# Patient Record
Sex: Male | Born: 1945
Health system: Southern US, Community
[De-identification: ages and names within clinical notes are randomized; demographics above are authoritative.]

## PROBLEM LIST (undated history)

## (undated) DIAGNOSIS — K219 Gastro-esophageal reflux disease without esophagitis: Secondary | ICD-10-CM

## (undated) DIAGNOSIS — E119 Type 2 diabetes mellitus without complications: Secondary | ICD-10-CM

## (undated) DIAGNOSIS — Z8719 Personal history of other diseases of the digestive system: Secondary | ICD-10-CM

## (undated) DIAGNOSIS — I10 Essential (primary) hypertension: Secondary | ICD-10-CM

## (undated) DIAGNOSIS — S52502A Unspecified fracture of the lower end of left radius, initial encounter for closed fracture: Secondary | ICD-10-CM

## (undated) HISTORY — PX: HERNIA REPAIR: SHX51

## (undated) HISTORY — PX: COLONOSCOPY: SHX174

## (undated) HISTORY — PX: ESOPHAGOGASTRODUODENOSCOPY: SHX1529

## (undated) HISTORY — PX: SHOULDER ARTHROSCOPY: SHX128

---

## 2002-06-01 ENCOUNTER — Emergency Department (HOSPITAL_COMMUNITY): Admission: EM | Admit: 2002-06-01 | Discharge: 2002-06-01 | Payer: Self-pay | Admitting: Emergency Medicine

## 2002-06-01 ENCOUNTER — Encounter: Payer: Self-pay | Admitting: Emergency Medicine

## 2004-07-13 ENCOUNTER — Ambulatory Visit (HOSPITAL_COMMUNITY): Admission: RE | Admit: 2004-07-13 | Discharge: 2004-07-13 | Payer: Self-pay | Admitting: *Deleted

## 2004-07-13 ENCOUNTER — Encounter (INDEPENDENT_AMBULATORY_CARE_PROVIDER_SITE_OTHER): Payer: Self-pay | Admitting: *Deleted

## 2005-06-03 ENCOUNTER — Emergency Department (HOSPITAL_COMMUNITY): Admission: EM | Admit: 2005-06-03 | Discharge: 2005-06-03 | Payer: Self-pay | Admitting: Emergency Medicine

## 2007-07-16 ENCOUNTER — Emergency Department (HOSPITAL_COMMUNITY): Admission: EM | Admit: 2007-07-16 | Discharge: 2007-07-16 | Payer: Self-pay | Admitting: Emergency Medicine

## 2008-07-18 ENCOUNTER — Emergency Department (HOSPITAL_COMMUNITY): Admission: EM | Admit: 2008-07-18 | Discharge: 2008-07-18 | Payer: Self-pay | Admitting: Emergency Medicine

## 2008-09-23 ENCOUNTER — Ambulatory Visit: Payer: Self-pay | Admitting: Gastroenterology

## 2008-10-07 ENCOUNTER — Ambulatory Visit: Payer: Self-pay | Admitting: Gastroenterology

## 2010-06-05 LAB — GLUCOSE, CAPILLARY
Glucose-Capillary: 15 mg/dL — CL (ref 70–99)
Glucose-Capillary: 166 mg/dL — ABNORMAL HIGH (ref 70–99)
Glucose-Capillary: 189 mg/dL — ABNORMAL HIGH (ref 70–99)

## 2010-07-16 NOTE — Op Note (Signed)
NAME:  Jacob Macdonald, Jacob Macdonald NO.:  [id number]   MEDICAL RECORD NO.:  [id number]          PATIENT TYPE:  AMB   LOCATION:  ENDO                         FACILITY:  [facility]   PHYSICIAN:  Georgiana Spinner, M.D.    DATE OF BIRTH:  11-Dec-1945   DATE OF PROCEDURE:  07/13/2004  DATE OF DISCHARGE:                                 OPERATIVE REPORT   ANESTHESIA:  Demerol 40 mg, Versed 2 mg additionally.   PROCEDURE:  With the patient mildly sedated in the left lateral decubitus  position, a rectal exam was performed.  The prostate felt normal.  Subsequently, the Olympus videoscopic colonoscope  was inserted in the rectum, passed under direct vision to the cecum,  identified by ileocecal valve and appendiceal orifice, both of which were  photographed.  From this point the colonoscope was slowly withdrawn taking  circumferential views of colonic mucosa, stopping only in the rectum, which  appeared normal on direct, showed hemorrhoids on retroflexed view.  The  endoscope was straightened and withdrawn.  The patient's vital signs and  pulse oximeter remained stable.  The patient tolerated the procedure without  apparent complications.   FINDINGS:  Internal hemorrhoids, otherwise unremarkable colonoscopic  examination to the cecum.   PLAN:  Consider repeat examination possibly in five years.      GMO/MEDQ  D:  07/13/2004  T:  07/13/2004  Job:  [id number]   cc:   Maryruth Bun. Jules Husbands, M.D.  Primary Care Clinic  [facility]  [place], [place]

## 2010-07-16 NOTE — Op Note (Signed)
NAME:  Jacob Macdonald, Jacob Macdonald NO.:  [id number]   MEDICAL RECORD NO.:  [id number]          PATIENT TYPE:  AMB   LOCATION:  ENDO                         FACILITY:  [facility]   PHYSICIAN:  Georgiana Spinner, M.D.    DATE OF BIRTH:  23-Jul-1945   DATE OF PROCEDURE:  07/13/2004  DATE OF DISCHARGE:                                 OPERATIVE REPORT   PROCEDURE:  Upper endoscopy.   INDICATIONS:  Hemoccult positivity.   ANESTHESIA:  1.  Demerol 60 mg.  2.  Versed 8 mg.   PROCEDURE:  With the patient mildly sedated in the left lateral decubitus  position, the Olympus videoscopic endoscope was inserted into the mouth and  passed under direct vision through the esophagus which appeared normal.  There was no evidence of Barrett's esophagus or esophagitis.  We entered  into the stomach.  Fundus, body, antrum, duodenal bulb, and second portion  of duodenum were visualized.  From this point, the endoscope was slowly  withdrawn, taking circumferential views of the duodenal mucosa until the  endoscope had been pulled back into the stomach, placed in retroflexion to  view the stomach from below.  The endoscope was straightened and withdrawn,  taking circumferential views of the remaining gastric and esophageal mucosa,  stopping in the body and fundus, where changes of erythema were seen,  photographed, and biopsied.  The patient's vital signs and pulse oximeter  remained stable.  The patient tolerated the procedure well, without apparent  complication.   FINDINGS:  Erythematous changes of the body and fundus consistent with  possible gastritis.  Await biopsy report.  Patient will call me for results  and follow up with me as an outpatient.  Proceed to colonoscopy as planned.      GMO/MEDQ  D:  07/13/2004  T:  07/13/2004  Job:  [id number]   cc:   Maryruth Bun. [facility]  Primary Care Clinic  [facility]  [place]

## 2013-10-19 ENCOUNTER — Encounter: Payer: Self-pay | Admitting: Gastroenterology

## 2015-07-24 ENCOUNTER — Encounter (HOSPITAL_COMMUNITY): Payer: Self-pay | Admitting: Emergency Medicine

## 2015-07-24 ENCOUNTER — Emergency Department (HOSPITAL_COMMUNITY): Payer: Medicare Other

## 2015-07-24 ENCOUNTER — Emergency Department (HOSPITAL_COMMUNITY)
Admission: EM | Admit: 2015-07-24 | Discharge: 2015-07-24 | Disposition: A | Discharge status: Home / Self Care | Payer: Medicare Other | Attending: Emergency Medicine | Admitting: Emergency Medicine

## 2015-07-24 DIAGNOSIS — Y939 Activity, unspecified: Secondary | ICD-10-CM | POA: Insufficient documentation

## 2015-07-24 DIAGNOSIS — F17210 Nicotine dependence, cigarettes, uncomplicated: Secondary | ICD-10-CM | POA: Insufficient documentation

## 2015-07-24 DIAGNOSIS — S52502A Unspecified fracture of the lower end of left radius, initial encounter for closed fracture: Secondary | ICD-10-CM | POA: Insufficient documentation

## 2015-07-24 DIAGNOSIS — W11XXXA Fall on and from ladder, initial encounter: Secondary | ICD-10-CM | POA: Diagnosis not present

## 2015-07-24 DIAGNOSIS — E119 Type 2 diabetes mellitus without complications: Secondary | ICD-10-CM | POA: Diagnosis not present

## 2015-07-24 DIAGNOSIS — S2232XA Fracture of one rib, left side, initial encounter for closed fracture: Secondary | ICD-10-CM | POA: Diagnosis not present

## 2015-07-24 DIAGNOSIS — S060X9A Concussion with loss of consciousness of unspecified duration, initial encounter: Secondary | ICD-10-CM | POA: Diagnosis not present

## 2015-07-24 DIAGNOSIS — S4992XA Unspecified injury of left shoulder and upper arm, initial encounter: Secondary | ICD-10-CM | POA: Diagnosis not present

## 2015-07-24 DIAGNOSIS — Y929 Unspecified place or not applicable: Secondary | ICD-10-CM | POA: Diagnosis not present

## 2015-07-24 DIAGNOSIS — Z7984 Long term (current) use of oral hypoglycemic drugs: Secondary | ICD-10-CM | POA: Insufficient documentation

## 2015-07-24 DIAGNOSIS — I10 Essential (primary) hypertension: Secondary | ICD-10-CM | POA: Insufficient documentation

## 2015-07-24 DIAGNOSIS — Y999 Unspecified external cause status: Secondary | ICD-10-CM | POA: Diagnosis not present

## 2015-07-24 DIAGNOSIS — S6992XA Unspecified injury of left wrist, hand and finger(s), initial encounter: Secondary | ICD-10-CM | POA: Diagnosis present

## 2015-07-24 DIAGNOSIS — S62102A Fracture of unspecified carpal bone, left wrist, initial encounter for closed fracture: Secondary | ICD-10-CM

## 2015-07-24 HISTORY — DX: Essential (primary) hypertension: I10

## 2015-07-24 HISTORY — DX: Type 2 diabetes mellitus without complications: E11.9

## 2015-07-24 MED ORDER — OXYCODONE-ACETAMINOPHEN 5-325 MG PO TABS: 1.0000 | ORAL_TABLET | Freq: Four times a day (QID) | ORAL | Status: DC | PRN

## 2015-07-24 MED ORDER — HYDROMORPHONE HCL 1 MG/ML IJ SOLN
1.0000 mg | INTRAMUSCULAR | Status: AC | PRN
  Administered 2015-07-24 (×3): 1 mg via INTRAVENOUS
  Filled 2015-07-24 (×3): qty 1

## 2015-07-24 MED ORDER — SODIUM CHLORIDE 0.9 % IV SOLN
INTRAVENOUS | Status: DC
  Administered 2015-07-24: 16:00:00 via INTRAVENOUS

## 2015-07-24 NOTE — ED Provider Notes (Signed)
CSN: [id number]     Arrival date & time 07/24/15  1532 History   First MD Initiated Contact with Patient 07/24/15 1535     Chief Complaint  Patient presents with  . Fall  . Wrist Injury  . Chest Pain  . Shortness of Breath   HPI Patient was working on a ladder today.  The ladder was on uneven surface and as he got towards the top the patient lost his balance and fell. Patient landed on his left side. He thinks he may have hit his head and briefly lost consciousness. He primarily is having pain in his left wrist and the left rib area. The pain in his wrist increases with any movement and palpation. His rib pain increases with breathing  . He denies any abdominal pain. No numbness or weakness. No lower extremity pain. Past Medical History  Diagnosis Date  . Diabetes mellitus without complication (HCC)   . Hypertension    Past Surgical History  Procedure Laterality Date  . Hernia repair     No family history on file. Social History  Substance Use Topics  . Smoking status: Current Every Day Smoker -- 1.00 packs/day    Types: Cigarettes  . Smokeless tobacco: None  . Alcohol Use: No    Review of Systems  All other systems reviewed and are negative.     Allergies  Lisinopril and Penicillins  Home Medications   Prior to Admission medications   Medication Sig Start Date End Date Taking? Authorizing Provider  amLODipine (NORVASC) 5 MG tablet Take 5 mg by mouth daily.   Yes Historical Provider, MD  clonazePAM (KLONOPIN) 1 MG tablet Take 1 mg by mouth at bedtime.  07/23/15  Yes Historical Provider, MD  glipiZIDE (GLUCOTROL) 10 MG tablet Take 10 mg by mouth 2 (two) times daily before a meal.   Yes Historical Provider, MD  HYDROcodone-acetaminophen (NORCO/VICODIN) 5-325 MG tablet Take 1 tablet by mouth 2 (two) times daily. Reported on 07/24/2015 07/16/15  Yes Historical Provider, MD  metFORMIN (GLUCOPHAGE-XR) 500 MG 24 hr tablet Take 1,000 mg by mouth 2 (two) times daily.   Yes  Historical Provider, MD  omeprazole (PRILOSEC) 20 MG capsule Take 20 mg by mouth daily. 06/09/15  Yes Historical Provider, MD  simvastatin (ZOCOR) 80 MG tablet Take 40 mg by mouth at bedtime. 06/06/15  Yes Historical Provider, MD  oxyCODONE-acetaminophen (PERCOCET/ROXICET) 5-325 MG tablet Take 1-2 tablets by mouth every 6 (six) hours as needed. 07/24/15   Linwood Dibbles, MD   BP 155/75 mmHg  Pulse 80  Temp(Src) 97.8 F (36.6 C) (Oral)  Resp 18  SpO2 97% Physical Exam  Constitutional: He appears well-developed and well-nourished. No distress.  HENT:  Head: Normocephalic and atraumatic. Head is without raccoon's eyes and without Battle's sign.  Right Ear: External ear normal.  Left Ear: External ear normal.  Eyes: Lids are normal. Right eye exhibits no discharge. Right conjunctiva has no hemorrhage. Left conjunctiva has no hemorrhage.  Neck: No spinous process tenderness present. No tracheal deviation and no edema present.  Cardiovascular: Normal rate, regular rhythm and normal heart sounds.   Pulmonary/Chest: Effort normal and breath sounds normal. No stridor. No respiratory distress. He exhibits no tenderness, no crepitus and no deformity.  Abdominal: Soft. Normal appearance and bowel sounds are normal. He exhibits no distension and no mass. There is no tenderness.  Negative for bruising  Musculoskeletal:       Cervical back: He exhibits no tenderness, no swelling and no  deformity.       Thoracic back: He exhibits no tenderness, no swelling and no deformity.       Lumbar back: He exhibits no tenderness and no swelling.  Pelvis stable, no ttp  Neurological: He is alert. He has normal strength. No sensory deficit. He exhibits normal muscle tone. GCS eye subscore is 4. GCS verbal subscore is 5. GCS motor subscore is 6.  Able to move all extremities, sensation intact throughout  Skin: He is not diaphoretic.  Psychiatric: He has a normal mood and affect. His speech is normal and behavior is normal.   Nursing note and vitals reviewed.   ED Course  Procedures (including critical care time) Labs Review Labs Reviewed - No data to display  Imaging Review Dg Ribs Unilateral W/chest Left  07/24/2015  CLINICAL DATA:  Left rib pain after fall from ladder today. Initial encounter. EXAM: LEFT RIBS AND CHEST - 3+ VIEW COMPARISON:  None. FINDINGS: Mildly displaced fracture is seen involving the lateral portion of the left seventh rib. No pneumothorax or pleural effusion is noted. No acute pulmonary disease is noted. Cardiomediastinal silhouette appears normal. IMPRESSION: Mildly displaced left seventh rib fracture. Electronically Signed   By: Lupita RaiderJames  Green Jr, M.D.   On: 07/24/2015 16:41   Dg Wrist Complete Left  07/24/2015  CLINICAL DATA:  Left wrist pain and deformity after fall from 8 foot ladder today. EXAM: LEFT WRIST - COMPLETE 3+ VIEW COMPARISON:  None. FINDINGS: There is a displaced/comminuted fracture of the distal left radius, with extension of the fracture line to the articular surface. There is posterior angulation deformity of the distal fracture fragment. Carpal bones appear intact and normally aligned throughout. Slight irregularity of the distal ulna is felt to be chronic. IMPRESSION: Displaced/comminuted fracture of the distal left radius, with extension of the fracture line to the articular surface. Associated posterior angulation deformity of the distal fracture fragment and at least some degree of impaction at the fracture site. Electronically Signed   By: Bary RichardStan  Maynard M.D.   On: 07/24/2015 16:40   Ct Head Wo Contrast  07/24/2015  CLINICAL DATA:  Larey SeatFell off an 8 foot ladder at home, loss of balance, landed on LEFT side, might intake head, denies loss of consciousness ; LEFT wrist deformity, LEFT chest pain EXAM: CT HEAD WITHOUT CONTRAST CT CERVICAL SPINE WITHOUT CONTRAST TECHNIQUE: Multidetector CT imaging of the head and cervical spine was performed following the standard protocol without  intravenous contrast. Multiplanar CT image reconstructions of the cervical spine were also generated. COMPARISON:  None FINDINGS: CT HEAD FINDINGS Mild atrophy. Normal ventricular morphology. No midline shift or mass effect. Tiny old lacunar infarct LEFT thalamus. Otherwise normal appearance of brain parenchyma. No intracranial hemorrhage, mass lesion, evidence acute infarction or extra-axial fluid collection. Visualized paranasal sinuses and mastoid air cells clear. No acute osseous findings. CT CERVICAL SPINE FINDINGS Scattered normal size cervical lymph nodes. Atherosclerotic calcifications particularly RIGHT carotid bifurcation. Prevertebral soft tissues normal thickness. Vertebral body heights maintained. Scattered anterior endplate spur formation throughout cervical spine. No acute fracture, subluxation, or bone destruction. Visualized skullbase intact. Lung apices clear. IMPRESSION: Tiny old lacunar infarct LEFT thalamus. No acute intracranial abnormalities. Degenerative disc disease changes cervical spine. No acute cervical spine abnormalities. Electronically Signed   By: Ulyses SouthwardMark  Boles M.D.   On: 07/24/2015 16:45   Ct Cervical Spine Wo Contrast  07/24/2015  CLINICAL DATA:  Larey SeatFell off an 8 foot ladder at home, loss of balance, landed on LEFT side, might  intake head, denies loss of consciousness ; LEFT wrist deformity, LEFT chest pain EXAM: CT HEAD WITHOUT CONTRAST CT CERVICAL SPINE WITHOUT CONTRAST TECHNIQUE: Multidetector CT imaging of the head and cervical spine was performed following the standard protocol without intravenous contrast. Multiplanar CT image reconstructions of the cervical spine were also generated. COMPARISON:  None FINDINGS: CT HEAD FINDINGS Mild atrophy. Normal ventricular morphology. No midline shift or mass effect. Tiny old lacunar infarct LEFT thalamus. Otherwise normal appearance of brain parenchyma. No intracranial hemorrhage, mass lesion, evidence acute infarction or extra-axial  fluid collection. Visualized paranasal sinuses and mastoid air cells clear. No acute osseous findings. CT CERVICAL SPINE FINDINGS Scattered normal size cervical lymph nodes. Atherosclerotic calcifications particularly RIGHT carotid bifurcation. Prevertebral soft tissues normal thickness. Vertebral body heights maintained. Scattered anterior endplate spur formation throughout cervical spine. No acute fracture, subluxation, or bone destruction. Visualized skullbase intact. Lung apices clear. IMPRESSION: Tiny old lacunar infarct LEFT thalamus. No acute intracranial abnormalities. Degenerative disc disease changes cervical spine. No acute cervical spine abnormalities. Electronically Signed   By: Ulyses Southward M.D.   On: 07/24/2015 16:45   Dg Shoulder Left  07/24/2015  CLINICAL DATA:  Left shoulder pain and limited range of motion after falling 8 feet from a ladder today. EXAM: LEFT SHOULDER - 2+ VIEW COMPARISON:  None. FINDINGS: Mild glenohumeral spur formation greater tuberosity hyperostosis. Mild AC joint degenerative changes. No fracture or dislocation. IMPRESSION: No fracture or dislocation.  Mild degenerative changes. Electronically Signed   By: Beckie Salts M.D.   On: 07/24/2015 17:50   I have personally reviewed and evaluated these images and lab results as part of my medical decision-making.    MDM   Final diagnoses:  Wrist fracture, left, closed, initial encounter  Left rib fracture, closed, initial encounter    Pt has a rib fx and wrist fx.  No other injuires noted on CT scan or plain films.  ABd is soft non tender.  Will dc home with splint and pain meds.  He is seen at [facility] and family called the office prior to arrival.  They called and will see the patient next week  (Dr Janee Morn)   Linwood Dibbles, MD 07/24/15 1807

## 2015-07-24 NOTE — Discharge Instructions (Signed)
Wrist Fracture °A wrist fracture is a break or crack in one of the bones of your wrist. Your wrist is made up of eight small bones at the palm of your hand (carpal bones) and two long bones that make up your forearm (radius and ulna). °CAUSES °· A direct blow to the wrist. °· Falling on an outstretched hand. °· Trauma, such as a car accident or a fall. °RISK FACTORS °Risk factors for wrist fracture include: °· Participating in contact and high-risk sports, such as skiing, biking, and ice skating. °· Taking steroid medicines. °· Smoking. °· Being male. °· Being Caucasian. °· Drinking more than three alcoholic beverages per day. °· Having low or lowered bone density (osteoporosis or osteopenia). °· Age. Older adults have decreased bone density. °· Women who have had menopause. °· History of previous fractures. °SIGNS AND SYMPTOMS °Symptoms of wrist fractures include tenderness, bruising, and inflammation. Additionally, the wrist may hang in an odd position or appear deformed. °DIAGNOSIS °Diagnosis may include: °· Physical exam. °· X-ray. °TREATMENT °Treatment depends on many factors, including the nature and location of the fracture, your age, and your activity level. Treatment for wrist fracture can be nonsurgical or surgical. °Nonsurgical Treatment °A plaster cast or splint may be applied to your wrist if the bone is in a good position. If the fracture is not in good position, it may be necessary for your health care provider to realign it before applying a splint or cast. Usually, a cast or splint will be worn for several weeks. °Surgical Treatment °Sometimes the position of the bone is so far out of place that surgery is required to apply a device to hold it together as it heals. Depending on the fracture, there are a number of options for holding the bone in place while it heals, such as a cast and metal pins. °HOME CARE INSTRUCTIONS °· Keep your injured wrist elevated and move your fingers as much as  possible. °· Do not put pressure on any part of your cast or splint. It may break. °· Use a plastic bag to protect your cast or splint from water while bathing or showering. Do not lower your cast or splint into water. °· Take medicines only as directed by your health care provider. °· Keep your cast or splint clean and dry. If it becomes wet, damaged, or suddenly feels too tight, contact your health care provider right away. °· Do not use any tobacco products including cigarettes, chewing tobacco, or electronic cigarettes. Tobacco can delay bone healing. If you need help quitting, ask your health care provider. °· Keep all follow-up visits as directed by your health care provider. This is important. °· Ask your health care provider if you should take supplements of calcium and vitamins C and D to promote bone healing. °SEEK MEDICAL CARE IF: °· Your cast or splint is damaged, breaks, or gets wet. °· You have a fever. °· You have chills. °· You have continued severe pain or more swelling than you did before the cast was put on. °SEEK IMMEDIATE MEDICAL CARE IF: °· Your hand or fingernails on the injured arm turn blue or gray, or feel cold or numb. °· You have decreased feeling in the fingers of your injured arm. °MAKE SURE YOU: °· Understand these instructions. °· Will watch your condition. °· Will get help right away if you are not doing well or get worse. °  °This information is not intended to replace advice given to you by your   health care provider. Make sure you discuss any questions you have with your health care provider.   Document Released: 11/24/2004 Document Revised: 11/05/2014 Document Reviewed: 03/04/2011 Elsevier Interactive Patient Education 2016 Elsevier Inc.   Rib Fracture A rib fracture is a break or crack in one of the bones of the ribs. The ribs are a group of long, curved bones that wrap around your chest and attach to your spine. They protect your lungs and other organs in the chest  cavity. A broken or cracked rib is often painful, but most do not cause other problems. Most rib fractures heal on their own over time. However, rib fractures can be more serious if multiple ribs are broken or if broken ribs move out of place and push against other structures. CAUSES   A direct blow to the chest. For example, this could happen during contact sports, a car accident, or a fall against a hard object.  Repetitive movements with high force, such as pitching a baseball or having severe coughing spells. SYMPTOMS   Pain when you breathe in or cough.  Pain when someone presses on the injured area. DIAGNOSIS  Your caregiver will perform a physical exam. Various imaging tests may be ordered to confirm the diagnosis and to look for related injuries. These tests may include a chest X-ray, computed tomography (CT), magnetic resonance imaging (MRI), or a bone scan. TREATMENT  Rib fractures usually heal on their own in 1-3 months. The longer healing period is often associated with a continued cough or other aggravating activities. During the healing period, pain control is very important. Medication is usually given to control pain. Hospitalization or surgery may be needed for more severe injuries, such as those in which multiple ribs are broken or the ribs have moved out of place.  HOME CARE INSTRUCTIONS   Avoid strenuous activity and any activities or movements that cause pain. Be careful during activities and avoid bumping the injured rib.  Gradually increase activity as directed by your caregiver.  Only take over-the-counter or prescription medications as directed by your caregiver. Do not take other medications without asking your caregiver first.  Apply ice to the injured area for the first 1-2 days after you have been treated or as directed by your caregiver. Applying ice helps to reduce inflammation and pain.  Put ice in a plastic bag.  Place a towel between your skin and the bag.    Leave the ice on for 15-20 minutes at a time, every 2 hours while you are awake.  Perform deep breathing as directed by your caregiver. This will help prevent pneumonia, which is a common complication of a broken rib. Your caregiver may instruct you to:  Take deep breaths several times a day.  Try to cough several times a day, holding a pillow against the injured area.  Use a device called an incentive spirometer to practice deep breathing several times a day.  Drink enough fluids to keep your urine clear or pale yellow. This will help you avoid constipation.   Do not wear a rib belt or binder. These restrict breathing, which can lead to pneumonia.  SEEK IMMEDIATE MEDICAL CARE IF:   You have a fever.   You have difficulty breathing or shortness of breath.   You develop a continual cough, or you cough up thick or bloody sputum.  You feel sick to your stomach (nausea), throw up (vomit), or have abdominal pain.   You have worsening pain not controlled with  medications.  MAKE SURE YOU:  Understand these instructions.  Will watch your condition.  Will get help right away if you are not doing well or get worse.   This information is not intended to replace advice given to you by your health care provider. Make sure you discuss any questions you have with your health care provider.   Document Released: 02/14/2005 Document Revised: 10/17/2012 Document Reviewed: 04/18/2012 Elsevier Interactive Patient Education [employer]2016 Elsevier Inc.

## 2015-07-24 NOTE — ED Notes (Addendum)
Pt from home via POV after falling from an 218ft ladder. Pt sts that he was on the second rung from the top step and lost his balance. Pt reports that he fell onto his left side. Pt sts he may have hit his head but denies LOC. Pt has obvious L wrist deformity. Pt c/o L sided chest pain just under L breast and complains that pain is worse upon inspiration. Pt is A&O and in NAD. C-collar in place

## 2015-07-24 NOTE — ED Notes (Signed)
Spoke to PA from [facility] where patient receives care. Recommended splint with follow up with Dr. Janee Mornhompson next week. Dr. Lynelle DoctorKnapp notified.

## 2015-07-29 ENCOUNTER — Other Ambulatory Visit: Payer: Self-pay | Admitting: Orthopedic Surgery

## 2015-07-29 ENCOUNTER — Encounter (HOSPITAL_BASED_OUTPATIENT_CLINIC_OR_DEPARTMENT_OTHER): Payer: Self-pay | Admitting: *Deleted

## 2015-07-30 NOTE — H&P (Signed)
BERNIE RANSFORD is an 70 y.o. male.   CC / Reason for Visit: Left wrist injury HPI: This patient is a 70 year old retired [employer] male who presents for evaluation of a left wrist injury that occurred when he fell 8 feet off of a ladder.  Apparently he also fractured a rib.  He was evaluated in emergency department and placed into a sugar tong splint for a distal radius fracture.  He has been taking Percocet, 2 tablets twice daily.  Past Medical History  Diagnosis Date  . Diabetes mellitus without complication (HCC)   . Hypertension   . GERD (gastroesophageal reflux disease)   . History of hiatal hernia   . Distal radius fracture, left     Past Surgical History  Procedure Laterality Date  . Hernia repair    . Shoulder arthroscopy Right     History reviewed. No pertinent family history. Social History:  reports that he has been smoking Cigarettes.  He has been smoking about 1.00 pack per day. He does not have any smokeless tobacco history on file. He reports that he does not drink alcohol or use illicit drugs.  Allergies:  Allergies  Allergen Reactions  . Lisinopril Swelling    REACTION: swelling  . Penicillins Other (See Comments)    REACTION: swelling Has patient had a PCN reaction causing immediate rash, facial/tongue/throat swelling, SOB or lightheadedness with hypotension: unknown Has patient had a PCN reaction causing severe rash involving mucus membranes or skin necrosis: unknown Has patient had a PCN reaction that required hospitalization unknown Has patient had a PCN reaction occurring within the last 10 years: unknown If all of the above answers are "NO", then may proceed with Cephalosporin use.     No prescriptions prior to admission    No results found for this or any previous visit (from the past 48 hour(s)). No results found.  Review of Systems  All other systems reviewed and are negative.   Height  (1.753 m), weight 77.111 kg (170 lb). Physical Exam   Constitutional:  WD, WN, NAD HEENT:  NCAT, EOMI Neuro/Psych:  Alert & oriented to person, place, and time; appropriate mood & affect Lymphatic: No generalized UE edema or lymphadenopathy Extremities / MSK:  Both UE are normal with respect to appearance, ranges of motion, joint stability, muscle strength/tone, sensation, & perfusion except as otherwise noted:  He sugar tong splint is tight with a lot of circumferential wraps, plaster digging into the ulnar side of the little finger.  This is taken down and adjusted by me today.  Intact light touch sensibility throughout the radial and ulnar aspects of all the digital tips with intact motor in the radial, median, and ulnar nerve distributions.  Digits are held nearly in full extension.  Labs / Xrays:  No radiographic studies obtained today.  Injury x-rays are reviewed revealing comminuted displaced intra-articular distal radius fracture  Assessment: Left closed comminuted displaced intra-articular distal radius fracture  Plan:  I discussed these findings with him, and recommended operative treatment to obtain and maintain acceptable alignment in order to optimize functional recovery with healing.  I discussed with him briefly the details of surgery, plan to be locked volar plate fixation, and the typical postoperative course of rehabilitation.  The details of the operative procedure were discussed with the patient.  Questions were invited and answered.  In addition to the goal of the procedure, the risks of the procedure to include but not limited to bleeding; infection; damage to the  nerves or blood vessels that could result in bleeding, numbness, weakness, chronic pain, and the need for additional procedures; stiffness; the need for revision surgery; and anesthetic risks were reviewed.  No specific outcome was guaranteed or implied.  Informed consent was obtained.  We will plan to proceed on Monday, 08-03-15.  Kaeson Kleinert, Linkon A., MD 07/30/2015, 3:31  PM

## 2015-07-31 ENCOUNTER — Encounter (HOSPITAL_BASED_OUTPATIENT_CLINIC_OR_DEPARTMENT_OTHER)
Admission: RE | Admit: 2015-07-31 | Discharge: 2015-07-31 | Disposition: A | Discharge status: Home / Self Care | Payer: Medicare Other | Source: Ambulatory Visit | Attending: Orthopedic Surgery | Admitting: Orthopedic Surgery

## 2015-07-31 DIAGNOSIS — Z880 Allergy status to penicillin: Secondary | ICD-10-CM | POA: Diagnosis not present

## 2015-07-31 DIAGNOSIS — F17200 Nicotine dependence, unspecified, uncomplicated: Secondary | ICD-10-CM | POA: Diagnosis not present

## 2015-07-31 DIAGNOSIS — Z7984 Long term (current) use of oral hypoglycemic drugs: Secondary | ICD-10-CM | POA: Diagnosis not present

## 2015-07-31 DIAGNOSIS — E119 Type 2 diabetes mellitus without complications: Secondary | ICD-10-CM | POA: Diagnosis not present

## 2015-07-31 DIAGNOSIS — I10 Essential (primary) hypertension: Secondary | ICD-10-CM | POA: Diagnosis not present

## 2015-07-31 DIAGNOSIS — W11XXXA Fall on and from ladder, initial encounter: Secondary | ICD-10-CM | POA: Diagnosis not present

## 2015-07-31 DIAGNOSIS — S52502A Unspecified fracture of the lower end of left radius, initial encounter for closed fracture: Secondary | ICD-10-CM | POA: Diagnosis present

## 2015-07-31 LAB — BASIC METABOLIC PANEL
ANION GAP: 11 (ref 5–15)
BUN: 12 mg/dL (ref 6–20)
CALCIUM: 8.7 mg/dL — AB (ref 8.9–10.3)
CHLORIDE: 99 mmol/L — AB (ref 101–111)
CO2: 26 mmol/L (ref 22–32)
Creatinine, Ser: 0.76 mg/dL (ref 0.61–1.24)
Glucose, Bld: 177 mg/dL — ABNORMAL HIGH (ref 65–99)
POTASSIUM: 4.2 mmol/L (ref 3.5–5.1)
SODIUM: 136 mmol/L (ref 135–145)

## 2015-08-03 ENCOUNTER — Ambulatory Visit (HOSPITAL_COMMUNITY): Payer: Medicare Other

## 2015-08-03 ENCOUNTER — Ambulatory Visit (HOSPITAL_BASED_OUTPATIENT_CLINIC_OR_DEPARTMENT_OTHER): Payer: Medicare Other | Admitting: Anesthesiology

## 2015-08-03 ENCOUNTER — Encounter (HOSPITAL_BASED_OUTPATIENT_CLINIC_OR_DEPARTMENT_OTHER): Payer: Self-pay

## 2015-08-03 ENCOUNTER — Ambulatory Visit (HOSPITAL_BASED_OUTPATIENT_CLINIC_OR_DEPARTMENT_OTHER)
Admission: RE | Admit: 2015-08-03 | Discharge: 2015-08-03 | Disposition: A | Discharge status: Home / Self Care | Payer: Medicare Other | Source: Ambulatory Visit | Attending: Orthopedic Surgery | Admitting: Orthopedic Surgery

## 2015-08-03 ENCOUNTER — Encounter (HOSPITAL_BASED_OUTPATIENT_CLINIC_OR_DEPARTMENT_OTHER)
Admission: RE | Disposition: A | Discharge status: Home / Self Care | Payer: Self-pay | Source: Ambulatory Visit | Attending: Orthopedic Surgery

## 2015-08-03 DIAGNOSIS — E119 Type 2 diabetes mellitus without complications: Secondary | ICD-10-CM | POA: Insufficient documentation

## 2015-08-03 DIAGNOSIS — T148XXA Other injury of unspecified body region, initial encounter: Secondary | ICD-10-CM

## 2015-08-03 DIAGNOSIS — Z7984 Long term (current) use of oral hypoglycemic drugs: Secondary | ICD-10-CM | POA: Insufficient documentation

## 2015-08-03 DIAGNOSIS — S52502A Unspecified fracture of the lower end of left radius, initial encounter for closed fracture: Secondary | ICD-10-CM | POA: Diagnosis not present

## 2015-08-03 DIAGNOSIS — F17200 Nicotine dependence, unspecified, uncomplicated: Secondary | ICD-10-CM | POA: Insufficient documentation

## 2015-08-03 DIAGNOSIS — W11XXXA Fall on and from ladder, initial encounter: Secondary | ICD-10-CM | POA: Insufficient documentation

## 2015-08-03 DIAGNOSIS — I10 Essential (primary) hypertension: Secondary | ICD-10-CM | POA: Diagnosis not present

## 2015-08-03 DIAGNOSIS — Z880 Allergy status to penicillin: Secondary | ICD-10-CM | POA: Insufficient documentation

## 2015-08-03 HISTORY — DX: Personal history of other diseases of the digestive system: Z87.19

## 2015-08-03 HISTORY — DX: Gastro-esophageal reflux disease without esophagitis: K21.9

## 2015-08-03 HISTORY — DX: Unspecified fracture of the lower end of left radius, initial encounter for closed fracture: S52.502A

## 2015-08-03 HISTORY — PX: OPEN REDUCTION INTERNAL FIXATION (ORIF) DISTAL RADIAL FRACTURE: SHX5989

## 2015-08-03 LAB — GLUCOSE, CAPILLARY
GLUCOSE-CAPILLARY: 170 mg/dL — AB (ref 65–99)
GLUCOSE-CAPILLARY: 194 mg/dL — AB (ref 65–99)

## 2015-08-03 SURGERY — OPEN REDUCTION INTERNAL FIXATION (ORIF) DISTAL RADIUS FRACTURE
Anesthesia: General | Site: Arm Lower | Laterality: Left

## 2015-08-03 MED ORDER — MIDAZOLAM HCL 2 MG/2ML IJ SOLN
INTRAMUSCULAR | Status: AC
  Filled 2015-08-03: qty 2

## 2015-08-03 MED ORDER — SCOPOLAMINE 1 MG/3DAYS TD PT72: 1.0000 | MEDICATED_PATCH | Freq: Once | TRANSDERMAL | Status: DC | PRN

## 2015-08-03 MED ORDER — CLINDAMYCIN PHOSPHATE 900 MG/50ML IV SOLN
900.0000 mg | INTRAVENOUS | Status: AC
  Administered 2015-08-03: 900 mg via INTRAVENOUS

## 2015-08-03 MED ORDER — BUPIVACAINE-EPINEPHRINE (PF) 0.5% -1:200000 IJ SOLN
INTRAMUSCULAR | Status: DC | PRN
  Administered 2015-08-03: 20 mL via PERINEURAL

## 2015-08-03 MED ORDER — PROPOFOL 10 MG/ML IV BOLUS
INTRAVENOUS | Status: AC
  Filled 2015-08-03: qty 20

## 2015-08-03 MED ORDER — OXYCODONE HCL 5 MG/5ML PO SOLN: 5.0000 mg | Freq: Once | ORAL | Status: DC | PRN

## 2015-08-03 MED ORDER — OXYCODONE HCL 5 MG PO TABS: 5.0000 mg | ORAL_TABLET | Freq: Once | ORAL | Status: DC | PRN

## 2015-08-03 MED ORDER — 0.9 % SODIUM CHLORIDE (POUR BTL) OPTIME
TOPICAL | Status: DC | PRN
  Administered 2015-08-03: 1000 mL

## 2015-08-03 MED ORDER — CLINDAMYCIN PHOSPHATE 900 MG/50ML IV SOLN
INTRAVENOUS | Status: AC
  Filled 2015-08-03: qty 50

## 2015-08-03 MED ORDER — FENTANYL CITRATE (PF) 100 MCG/2ML IJ SOLN: 25.0000 ug | INTRAMUSCULAR | Status: DC | PRN

## 2015-08-03 MED ORDER — DEXAMETHASONE SODIUM PHOSPHATE 4 MG/ML IJ SOLN
INTRAMUSCULAR | Status: DC | PRN
  Administered 2015-08-03: 5 mg via INTRAVENOUS

## 2015-08-03 MED ORDER — DEXAMETHASONE SODIUM PHOSPHATE 10 MG/ML IJ SOLN
INTRAMUSCULAR | Status: AC
  Filled 2015-08-03: qty 1

## 2015-08-03 MED ORDER — FENTANYL CITRATE (PF) 100 MCG/2ML IJ SOLN
INTRAMUSCULAR | Status: AC
  Filled 2015-08-03: qty 2

## 2015-08-03 MED ORDER — LACTATED RINGERS IV SOLN: INTRAVENOUS | Status: DC

## 2015-08-03 MED ORDER — LIDOCAINE 2% (20 MG/ML) 5 ML SYRINGE
INTRAMUSCULAR | Status: DC | PRN
Start: 2015-08-03 — End: 2015-08-03
  Administered 2015-08-03: 100 mg via INTRAVENOUS

## 2015-08-03 MED ORDER — GLYCOPYRROLATE 0.2 MG/ML IJ SOLN: 0.2000 mg | Freq: Once | INTRAMUSCULAR | Status: DC | PRN

## 2015-08-03 MED ORDER — FENTANYL CITRATE (PF) 100 MCG/2ML IJ SOLN
50.0000 ug | INTRAMUSCULAR | Status: AC | PRN
  Administered 2015-08-03: 25 ug via INTRAVENOUS
  Administered 2015-08-03: 100 ug via INTRAVENOUS
  Administered 2015-08-03 (×2): 25 ug via INTRAVENOUS

## 2015-08-03 MED ORDER — MEPERIDINE HCL 25 MG/ML IJ SOLN: 6.2500 mg | INTRAMUSCULAR | Status: DC | PRN

## 2015-08-03 MED ORDER — OXYCODONE-ACETAMINOPHEN 5-325 MG PO TABS: 1.0000 | ORAL_TABLET | Freq: Four times a day (QID) | ORAL | Status: DC | PRN

## 2015-08-03 MED ORDER — ONDANSETRON HCL 4 MG/2ML IJ SOLN
INTRAMUSCULAR | Status: AC
  Filled 2015-08-03: qty 2

## 2015-08-03 MED ORDER — LIDOCAINE 2% (20 MG/ML) 5 ML SYRINGE
INTRAMUSCULAR | Status: AC
  Filled 2015-08-03: qty 5

## 2015-08-03 MED ORDER — PROPOFOL 10 MG/ML IV BOLUS
INTRAVENOUS | Status: DC | PRN
  Administered 2015-08-03: 200 mg via INTRAVENOUS

## 2015-08-03 MED ORDER — MIDAZOLAM HCL 2 MG/2ML IJ SOLN
1.0000 mg | INTRAMUSCULAR | Status: DC | PRN
  Administered 2015-08-03: 2 mg via INTRAVENOUS

## 2015-08-03 MED ORDER — ONDANSETRON HCL 4 MG/2ML IJ SOLN
INTRAMUSCULAR | Status: DC | PRN
  Administered 2015-08-03: 4 mg via INTRAVENOUS

## 2015-08-03 MED ORDER — METOCLOPRAMIDE HCL 5 MG/ML IJ SOLN: 10.0000 mg | Freq: Once | INTRAMUSCULAR | Status: DC | PRN

## 2015-08-03 MED ORDER — LACTATED RINGERS IV SOLN
INTRAVENOUS | Status: DC
  Administered 2015-08-03 (×2): via INTRAVENOUS

## 2015-08-03 SURGICAL SUPPLY — 70 items
BANDAGE COBAN STERILE 2 (GAUZE/BANDAGES/DRESSINGS) IMPLANT
BIT DRILL SOLID 2.0X40MM (BIT) IMPLANT
BIT DRILL SOLID 2.5X40MM (BIT) IMPLANT
BLADE MINI RND TIP GREEN BEAV (BLADE) IMPLANT
BLADE SURG 15 STRL LF DISP TIS (BLADE) ×1 IMPLANT
BLADE SURG 15 STRL SS (BLADE) ×3
BNDG CMPR 9X4 STRL LF SNTH (GAUZE/BANDAGES/DRESSINGS) ×1
BNDG COHESIVE 4X5 TAN STRL (GAUZE/BANDAGES/DRESSINGS) ×3 IMPLANT
BNDG ESMARK 4X9 LF (GAUZE/BANDAGES/DRESSINGS) ×3 IMPLANT
BNDG GAUZE ELAST 4 BULKY (GAUZE/BANDAGES/DRESSINGS) ×4 IMPLANT
BRUSH SCRUB EZ PLAIN DRY (MISCELLANEOUS) IMPLANT
CANISTER SUCT 1200ML W/VALVE (MISCELLANEOUS) ×3 IMPLANT
CHLORAPREP W/TINT 26ML (MISCELLANEOUS) ×3 IMPLANT
CORDS BIPOLAR (ELECTRODE) ×3 IMPLANT
COVER BACK TABLE 60X90IN (DRAPES) ×3 IMPLANT
COVER MAYO STAND STRL (DRAPES) ×3 IMPLANT
CUFF TOURNIQUET SINGLE 18IN (TOURNIQUET CUFF) IMPLANT
CUFF TOURNIQUET SINGLE 24IN (TOURNIQUET CUFF) IMPLANT
DRAPE C-ARM 42X72 X-RAY (DRAPES) ×3 IMPLANT
DRAPE EXTREMITY T 121X128X90 (DRAPE) ×3 IMPLANT
DRAPE SURG 17X23 STRL (DRAPES) ×3 IMPLANT
DRILL SOLID 2.0X40MM (BIT)
DRILL SOLID 2.5X40MM (BIT)
DRSG ADAPTIC 3X8 NADH LF (GAUZE/BANDAGES/DRESSINGS) ×3 IMPLANT
DRSG EMULSION OIL 3X3 NADH (GAUZE/BANDAGES/DRESSINGS) IMPLANT
ELECT REM PT RETURN 9FT ADLT (ELECTROSURGICAL) ×3
ELECTRODE REM PT RTRN 9FT ADLT (ELECTROSURGICAL) ×1 IMPLANT
GAUZE SPONGE 4X4 12PLY STRL (GAUZE/BANDAGES/DRESSINGS) ×3 IMPLANT
GLOVE BIO SURGEON STRL SZ7.5 (GLOVE) ×3 IMPLANT
GLOVE BIOGEL PI IND STRL 7.0 (GLOVE) ×1 IMPLANT
GLOVE BIOGEL PI IND STRL 8 (GLOVE) ×1 IMPLANT
GLOVE BIOGEL PI INDICATOR 7.0 (GLOVE) ×2
GLOVE BIOGEL PI INDICATOR 8 (GLOVE) ×2
GLOVE ECLIPSE 6.5 STRL STRAW (GLOVE) ×3 IMPLANT
GOWN STRL REUS W/ TWL LRG LVL3 (GOWN DISPOSABLE) ×2 IMPLANT
GOWN STRL REUS W/TWL LRG LVL3 (GOWN DISPOSABLE) ×6
GOWN STRL REUS W/TWL XL LVL3 (GOWN DISPOSABLE) ×3 IMPLANT
GUIDE AIMING 1.5MM (WIRE) ×2 IMPLANT
NDL HYPO 25X1 1.5 SAFETY (NEEDLE) IMPLANT
NEEDLE HYPO 25X1 1.5 SAFETY (NEEDLE) IMPLANT
NS IRRIG 1000ML POUR BTL (IV SOLUTION) ×3 IMPLANT
PACK BASIN DAY SURGERY FS (CUSTOM PROCEDURE TRAY) ×3 IMPLANT
PADDING CAST ABS 4INX4YD NS (CAST SUPPLIES) ×2
PADDING CAST ABS COTTON 4X4 ST (CAST SUPPLIES) IMPLANT
PENCIL BUTTON HOLSTER BLD 10FT (ELECTRODE) ×3 IMPLANT
RUBBERBAND STERILE (MISCELLANEOUS) IMPLANT
SKELETAL DYNAMICS DVR SET (Set) ×2 IMPLANT
SLEEVE SCD COMPRESS KNEE MED (MISCELLANEOUS) ×3 IMPLANT
SLING ARM FOAM STRAP LRG (SOFTGOODS) ×3 IMPLANT
SPLINT PLASTER CAST XFAST 3X15 (CAST SUPPLIES) IMPLANT
SPLINT PLASTER XTRA FASTSET 3X (CAST SUPPLIES)
SPONGE GAUZE 4X4 12PLY STER LF (GAUZE/BANDAGES/DRESSINGS) ×2 IMPLANT
STOCKINETTE 6  STRL (DRAPES) ×2
STOCKINETTE 6 STRL (DRAPES) ×1 IMPLANT
SUCTION FRAZIER HANDLE 10FR (MISCELLANEOUS) ×2
SUCTION TUBE FRAZIER 10FR DISP (MISCELLANEOUS) ×1 IMPLANT
SUT VIC AB 2-0 PS2 27 (SUTURE) ×3 IMPLANT
SUT VICRYL 4-0 PS2 18IN ABS (SUTURE) IMPLANT
SUT VICRYL RAPIDE 4-0 (SUTURE) IMPLANT
SUT VICRYL RAPIDE 4/0 PS 2 (SUTURE) ×3 IMPLANT
SYR BULB 3OZ (MISCELLANEOUS) ×3 IMPLANT
SYRINGE 10CC LL (SYRINGE) IMPLANT
TAPE STRIPS DRAPE STRL (GAUZE/BANDAGES/DRESSINGS) ×2 IMPLANT
TOWEL OR 17X24 6PK STRL BLUE (TOWEL DISPOSABLE) ×3 IMPLANT
TOWEL OR NON WOVEN STRL DISP B (DISPOSABLE) ×3 IMPLANT
TUBE CONNECTING 20'X1/4 (TUBING) ×1
TUBE CONNECTING 20X1/4 (TUBING) ×2 IMPLANT
UNDERPAD 30X30 (UNDERPADS AND DIAPERS) ×3 IMPLANT
WIRE FIX 1.5 STANDARD TIP (WIRE)
WIRE FIX 1.5 STD TIP (WIRE) IMPLANT

## 2015-08-03 NOTE — Op Note (Signed)
08/03/2015  7:44 AM  PATIENT:  Jacob Fallsavid H Macdonald  70 y.o. male  PRE-OPERATIVE DIAGNOSIS:  Displaced comminuted left intra-articular distal radius fracture  POST-OPERATIVE DIAGNOSIS:  Same  PROCEDURE:  ORIF Left intra-articular comminuted distal radius fracture (>2 parts)  SURGEON: Cliffton Astersavid A. Janee Mornhompson, MD  PHYSICIAN ASSISTANT: Danielle RankinKirsten Schrader, OPA-C  ANESTHESIA:  regional and general  SPECIMENS:  None  DRAINS: None  EBL:  less than 50 mL  PREOPERATIVE INDICATIONS:  Jacob Macdonald is a  70 y.o. male with a comminuted left intra-articular distal radius fracture  The risks benefits and alternatives were discussed with the patient preoperatively including but not limited to the risks of infection, bleeding, nerve injury, cardiopulmonary complications, the need for revision surgery, among others, and the patient verbalized understanding and consented to proceed.  OPERATIVE IMPLANTS: [employer] distal radius plate and assoc screws/pegs  OPERATIVE PROCEDURE: After receiving prophylactic antibiotics and a regional block, the patient was escorted to the operative theatre and placed in a supine position. General anesthesia was administered.  A surgical "time-out" was performed during which the planned procedure, proposed operative site, and the correct patient identity were compared to the operative consent and agreement confirmed by the circulating nurse according to current facility policy. Following application of a tourniquet to the operative extremity, the exposed skin was pre-scrub with Hibiclens scrub brush and then was prepped with Chloraprep and draped in the usual sterile fashion. The limb was exsanguinated with an Esmarch bandage and the tourniquet inflated to approximately 100mmHg higher than systolic BP.   A sinusoidal-shaped incision was marked and made over the FCR axis and the distal forearm. The skin was incised sharply with scalpel, subcutaneous tissues with blunt and  spreading dissection. The FCR axis was exploited deeply. The pronator quadratus was reflected in an L-shaped ulnarly and the brachioradialis was split in a Z-plasty fashion for later reapproximation. The fracture was inspected and provisionally reduced.  This was confirmed fluoroscopically. The appropriately sized plate was selected and found to fit well. It was placed in its provisional alignment of the radius and this was confirmed fluoroscopically.  It was secured to the radius with a screw through the slotted hole.  Additional adjustments were made as necessary, and the distal holes were all drilled and filled.  Peg/screw length distally was selected on the shorter side of measurements to minimize the risk for dorsal cortical penetration. The remainder of the proximal holes were drilled and filled.   Final images were obtained and the DRUJ was examined for stability. It was found to be sufficiently stable. The wound was then copiously irrigated and the brachioradialis repaired with 2-0 Vicryl Rapide suture followed by repair of the pronator quadratus with the same suture type. Tourniquet was released and additional hemostasis obtained and the skin was closed with 2-0 Vicryl deep dermal buried sutures followed by running 4-0 Vicryl Rapide horizontal mattress suture in the skin. A bulky dressing with a volar plaster component was applied and she was taken to room stable condition.  DISPOSITION: The patient will be discharged home today with typical post-op instructions, returning in 10-15 days for reevaluation with new x-rays of the affected wrist out of the splint to include an inclined lateral and then transition to therapy to have a custom splint constructed and begin rehabilitation.

## 2015-08-03 NOTE — Interval H&P Note (Signed)
History and Physical Interval Note:  08/03/2015 7:43 AM  Jacob Macdonald  has presented today for surgery, with the diagnosis of LEFT DISTAL RADIUS FRACTURE S52.572A  The various methods of treatment have been discussed with the patient and family. After consideration of risks, benefits and other options for treatment, the patient has consented to  Procedure(s) with comments: OPEN TREATMENT OF LEFT DISTAL RADIUS FRACTURE (Left) - GENERAL ANESTHESIA WITH PRE-OP BLOCK as a surgical intervention .  The patient's history has been reviewed, patient examined, no change in status, stable for surgery.  I have reviewed the patient's chart and labs.  Questions were answered to the patient's satisfaction.     Tawanna Funk, Yisrael A.

## 2015-08-03 NOTE — Progress Notes (Signed)
Assisted Dr. Carignan with left, ultrasound guided, interscalene  block. Side rails up, monitors on throughout procedure. See vital signs in flow sheet. Tolerated Procedure well. 

## 2015-08-03 NOTE — Transfer of Care (Signed)
Immediate Anesthesia Transfer of Care Note  Patient: Donnita FallsDavid H Zipp  Procedure(s) Performed: Procedure(s) with comments: OPEN TREATMENT OF LEFT DISTAL RADIUS FRACTURE (Left) - GENERAL ANESTHESIA WITH PRE-OP BLOCK  Patient Location: PACU  Anesthesia Type:GA combined with regional for post-op pain  Level of Consciousness: awake, sedated and patient cooperative  Airway & Oxygen Therapy: Patient Spontanous Breathing and Patient connected to face mask oxygen  Post-op Assessment: Report given to RN and Post -op Vital signs reviewed and stable  Post vital signs: Reviewed and stable  Last Vitals:  Filed Vitals:   08/03/15 0715 08/03/15 0720  BP: 168/63   Pulse: 91 83  Temp:    Resp: 9 11    Last Pain:  Filed Vitals:   08/03/15 0722  PainSc: 8       Patients Stated Pain Goal: 2 (08/03/15 0629)  Complications: No apparent anesthesia complications

## 2015-08-03 NOTE — Anesthesia Postprocedure Evaluation (Signed)
Anesthesia Post Note  Patient: Jacob FallsDavid H Beier  Procedure(s) Performed: Procedure(s) (LRB): OPEN TREATMENT OF LEFT DISTAL RADIUS FRACTURE (Left)  Patient location during evaluation: PACU Anesthesia Type: General and Regional Level of consciousness: awake and alert Pain management: pain level controlled Vital Signs Assessment: post-procedure vital signs reviewed and stable Respiratory status: spontaneous breathing, nonlabored ventilation, respiratory function stable and patient connected to nasal cannula oxygen Cardiovascular status: blood pressure returned to baseline and stable Postop Assessment: no signs of nausea or vomiting Anesthetic complications: no    Last Vitals:  Filed Vitals:   08/03/15 1000 08/03/15 1030  BP:  135/79  Pulse: 70 75  Temp:  36.4 C  Resp: 16 16    Last Pain:  Filed Vitals:   08/03/15 1037  PainSc: 0-No pain                 Phillips Groutarignan, Chelsa Stout

## 2015-08-03 NOTE — Discharge Instructions (Signed)

## 2015-08-03 NOTE — Anesthesia Procedure Notes (Addendum)
Anesthesia Regional Block:  Supraclavicular block  Pre-Anesthetic Checklist: ,, timeout performed, Correct Patient, Correct Site, Correct Laterality, Correct Procedure, Correct Position, site marked, Risks and benefits discussed,  Surgical consent,  Pre-op evaluation,  At surgeon's request and post-op pain management  Laterality: Left and Upper  Prep: Maximum Sterile Barrier Precautions used and chloraprep       Needles:  Injection technique: Single-shot  Needle Type: Echogenic Stimulator Needle     Needle Length: 10cm 10 cm Needle Gauge: 21 and 21 G    Additional Needles:  Procedures: ultrasound guided (picture in chart) Supraclavicular block Narrative:  Injection made incrementally with aspirations every 5 mL.  Performed by: Personally   Additional Notes: Risks, benefits and alternative to block explained extensively.  Patient tolerated procedure well, without complications.   Procedure Name: LMA Insertion Date/Time: 08/03/2015 7:53 AM Performed by: Gar GibbonKEETON, Navdeep Fessenden S Pre-anesthesia Checklist: Patient identified, Emergency Drugs available, Suction available and Patient being monitored Patient Re-evaluated:Patient Re-evaluated prior to inductionOxygen Delivery Method: Circle system utilized Preoxygenation: Pre-oxygenation with 100% oxygen Intubation Type: IV induction Ventilation: Mask ventilation without difficulty LMA: LMA inserted LMA Size: 4.0 Number of attempts: 1 Airway Equipment and Method: Bite block Placement Confirmation: positive ETCO2 Tube secured with: Tape Dental Injury: Teeth and Oropharynx as per pre-operative assessment

## 2015-08-03 NOTE — Anesthesia Preprocedure Evaluation (Addendum)
Anesthesia Evaluation  Patient identified by MRN, date of birth, ID band Patient awake    Reviewed: Allergy & Precautions, NPO status , Patient's Chart, lab work & pertinent test results  Airway Mallampati: II  TM Distance: >3 FB Neck ROM: Full    Dental no notable dental hx.    Pulmonary Current Smoker,    Pulmonary exam normal breath sounds clear to auscultation       Cardiovascular hypertension, Pt. on medications Normal cardiovascular exam Rhythm:Regular Rate:Normal     Neuro/Psych negative neurological ROS  negative psych ROS   GI/Hepatic negative GI ROS, Neg liver ROS,   Endo/Other  diabetes, Type 2, Oral Hypoglycemic Agents  Renal/GU negative Renal ROS  negative genitourinary   Musculoskeletal negative musculoskeletal ROS (+)   Abdominal   Peds negative pediatric ROS (+)  Hematology negative hematology ROS (+)   Anesthesia Other Findings   Reproductive/Obstetrics negative OB ROS                            Anesthesia Physical Anesthesia Plan  ASA: II  Anesthesia Plan: General   Post-op Pain Management: GA combined w/ Regional for post-op pain   Induction: Intravenous  Airway Management Planned: LMA  Additional Equipment:   Intra-op Plan:   Post-operative Plan: Extubation in OR  Informed Consent: I have reviewed the patients History and Physical, chart, labs and discussed the procedure including the risks, benefits and alternatives for the proposed anesthesia with the patient or authorized representative who has indicated his/her understanding and acceptance.   Dental advisory given  Plan Discussed with: CRNA  Anesthesia Plan Comments: (SCB)        Anesthesia Quick Evaluation

## 2015-08-04 ENCOUNTER — Encounter (HOSPITAL_BASED_OUTPATIENT_CLINIC_OR_DEPARTMENT_OTHER): Payer: Self-pay | Admitting: Orthopedic Surgery

## 2016-09-23 DIAGNOSIS — S20212A Contusion of left front wall of thorax, initial encounter: Secondary | ICD-10-CM | POA: Diagnosis not present

## 2017-03-05 IMAGING — RF DG WRIST COMPLETE 3+V*L*
1 series · 4 of 4 positions shown · non-contrast
Comparison: Preoperative wrist series of July 24, 2015

CLINICAL DATA: ORIF of the distal radial fracture.

EXAM:
DG C-ARM 61-120 MIN; LEFT WRIST - COMPLETE 3+ VIEW

[Series 1: run · 4 of 4 slices shown]
[im 1/4]
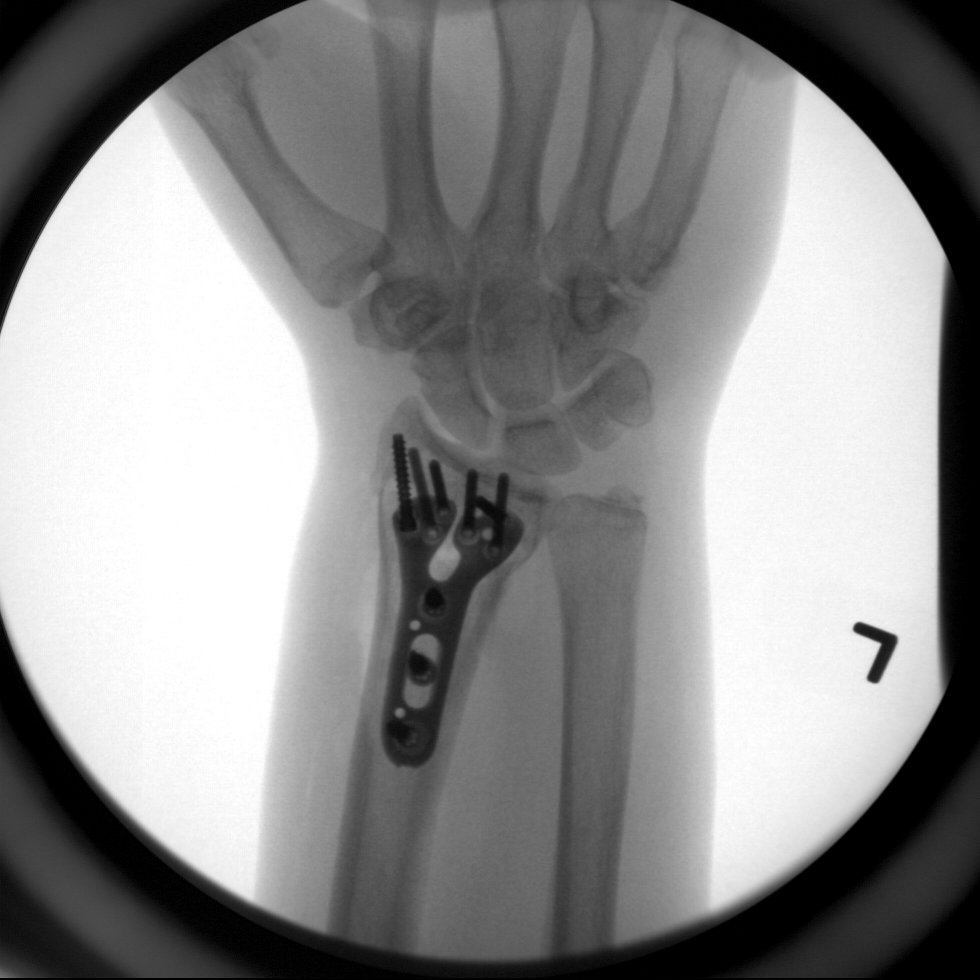
[im 2/4]
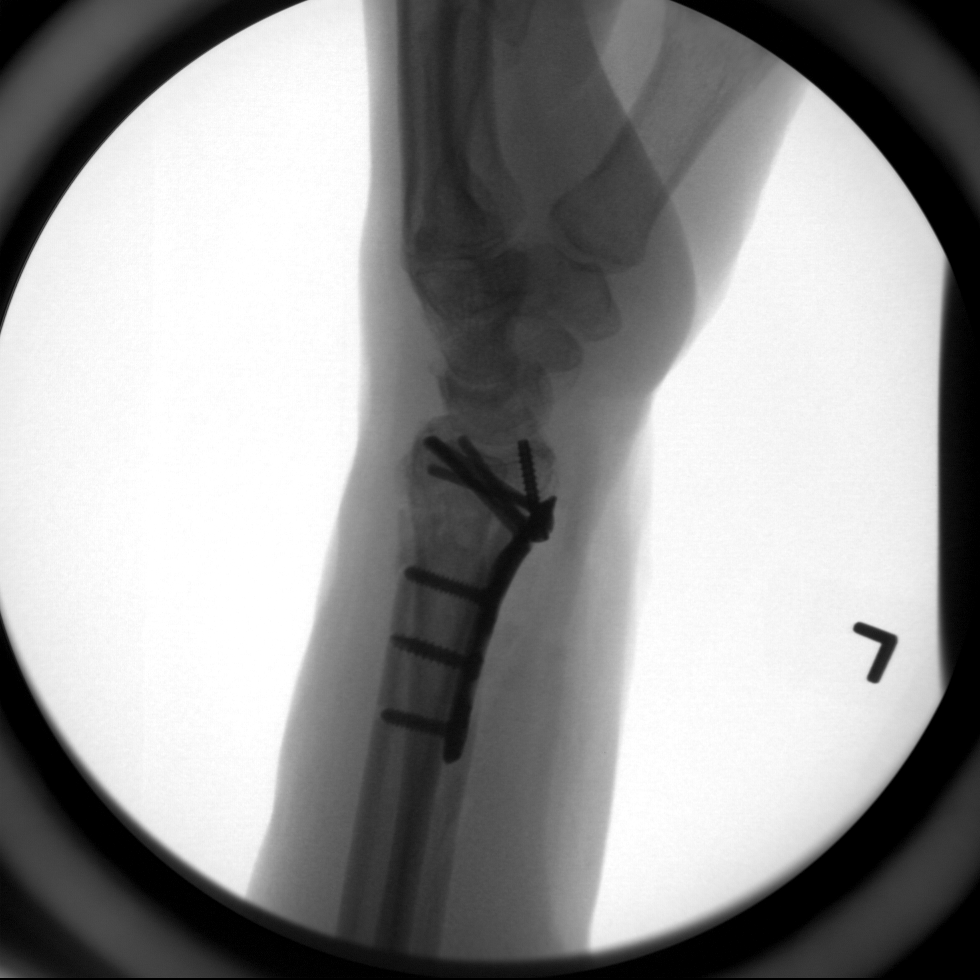
[im 3/4]
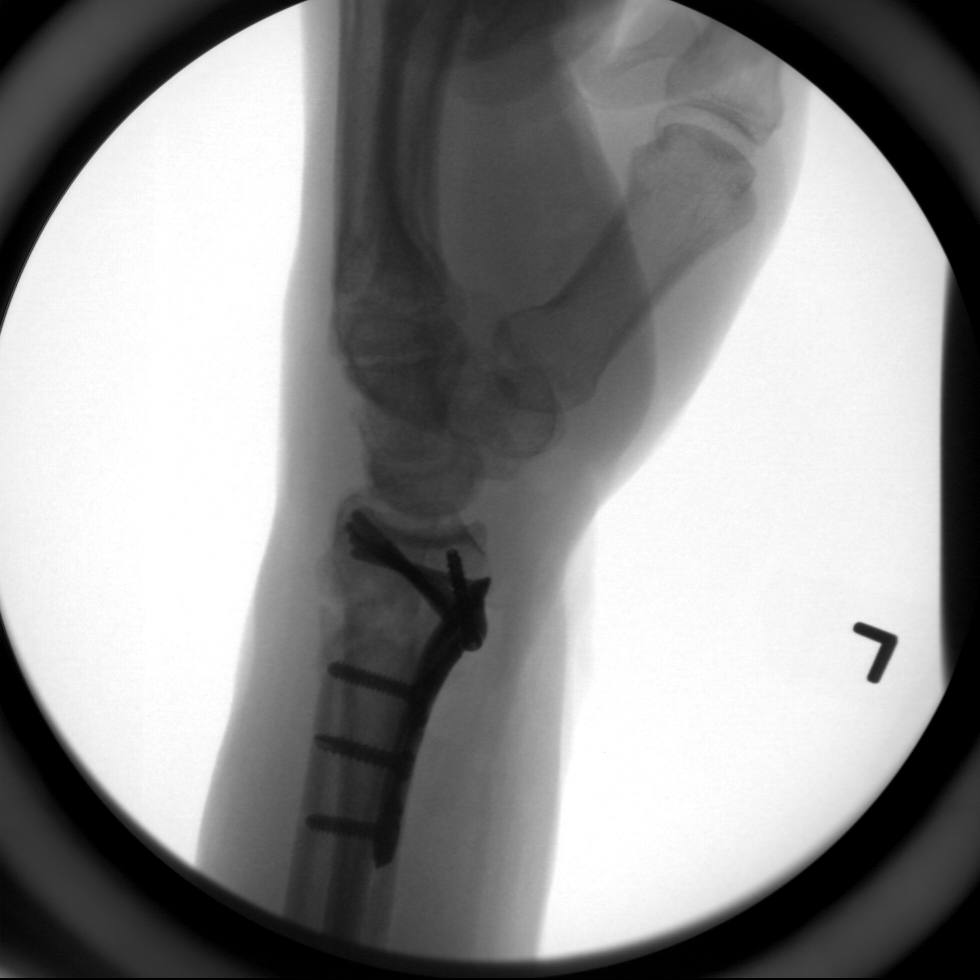
[im 4/4]
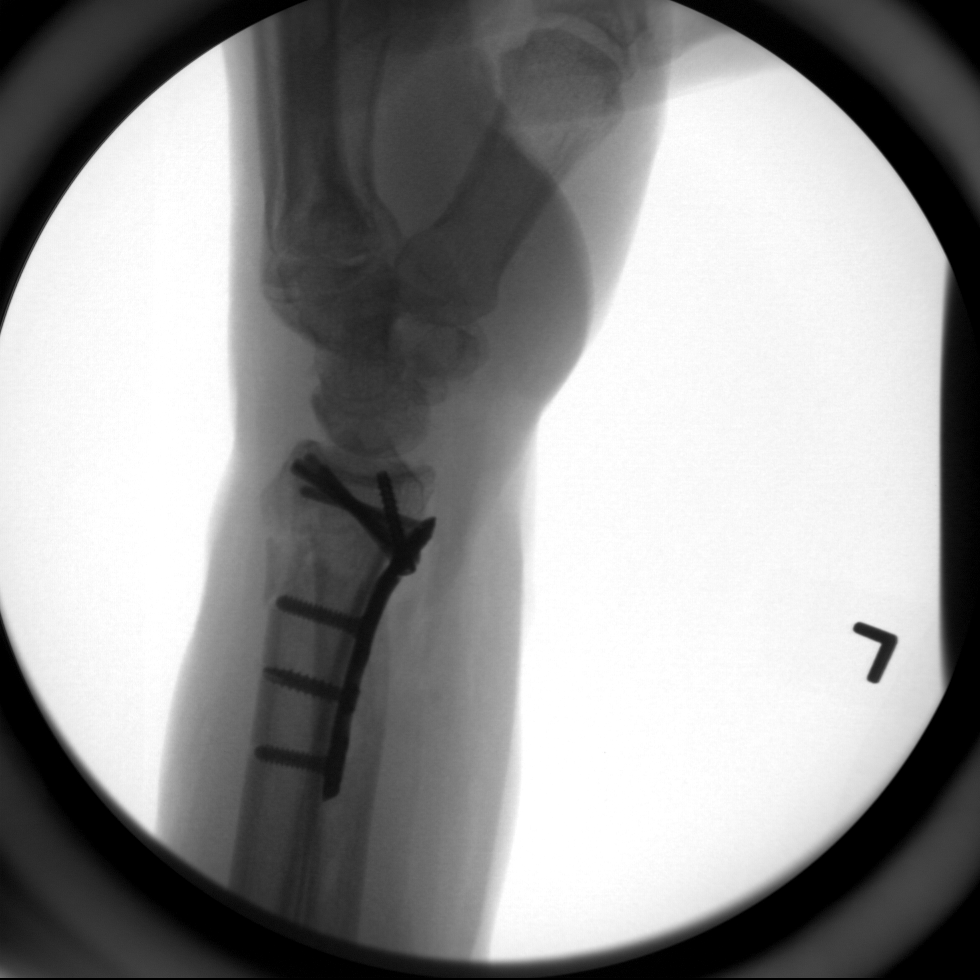

[4 of 4 positions shown; findings below may reference images not displayed]

FINDINGS: Fluoro time reported is 0 minutes, 48 seconds. 4 fluoro spot images
reveal the patient to of undergone plate and screw ORIF of the
distal radial fracture. Alignment of the fracture fragments is near
anatomic. The adjacent ulna appears intact. The carpal bones are
normal where visualized.
IMPRESSION: The patient has undergone successful ORIF with plate and screw
fixation of the comminuted intra-articular distal radial metaphyseal
fracture. There is no immediate postprocedure complication.

## 2017-12-13 ENCOUNTER — Emergency Department (HOSPITAL_COMMUNITY): Payer: PPO

## 2017-12-13 ENCOUNTER — Other Ambulatory Visit: Payer: Self-pay

## 2017-12-13 ENCOUNTER — Inpatient Hospital Stay (HOSPITAL_COMMUNITY)
Admission: EM | Admit: 2017-12-13 | Discharge: 2017-12-15 | DRG: 185 | Disposition: A | Discharge status: Home / Self Care | Payer: PPO | Attending: Surgery | Admitting: Surgery

## 2017-12-13 ENCOUNTER — Encounter (HOSPITAL_COMMUNITY): Payer: Self-pay | Admitting: Emergency Medicine

## 2017-12-13 DIAGNOSIS — S3993XA Unspecified injury of pelvis, initial encounter: Secondary | ICD-10-CM | POA: Diagnosis not present

## 2017-12-13 DIAGNOSIS — S2242XA Multiple fractures of ribs, left side, initial encounter for closed fracture: Secondary | ICD-10-CM | POA: Diagnosis not present

## 2017-12-13 DIAGNOSIS — W19XXXA Unspecified fall, initial encounter: Secondary | ICD-10-CM

## 2017-12-13 DIAGNOSIS — Z91013 Allergy to seafood: Secondary | ICD-10-CM | POA: Diagnosis not present

## 2017-12-13 DIAGNOSIS — I10 Essential (primary) hypertension: Secondary | ICD-10-CM | POA: Diagnosis present

## 2017-12-13 DIAGNOSIS — E1165 Type 2 diabetes mellitus with hyperglycemia: Secondary | ICD-10-CM | POA: Diagnosis present

## 2017-12-13 DIAGNOSIS — Z880 Allergy status to penicillin: Secondary | ICD-10-CM | POA: Diagnosis not present

## 2017-12-13 DIAGNOSIS — Z79899 Other long term (current) drug therapy: Secondary | ICD-10-CM

## 2017-12-13 DIAGNOSIS — K219 Gastro-esophageal reflux disease without esophagitis: Secondary | ICD-10-CM | POA: Diagnosis present

## 2017-12-13 DIAGNOSIS — S2231XA Fracture of one rib, right side, initial encounter for closed fracture: Secondary | ICD-10-CM | POA: Diagnosis not present

## 2017-12-13 DIAGNOSIS — F17210 Nicotine dependence, cigarettes, uncomplicated: Secondary | ICD-10-CM | POA: Diagnosis present

## 2017-12-13 DIAGNOSIS — S2232XA Fracture of one rib, left side, initial encounter for closed fracture: Secondary | ICD-10-CM

## 2017-12-13 DIAGNOSIS — Z7982 Long term (current) use of aspirin: Secondary | ICD-10-CM | POA: Diagnosis not present

## 2017-12-13 DIAGNOSIS — W11XXXA Fall on and from ladder, initial encounter: Secondary | ICD-10-CM | POA: Diagnosis not present

## 2017-12-13 DIAGNOSIS — Z888 Allergy status to other drugs, medicaments and biological substances status: Secondary | ICD-10-CM | POA: Diagnosis not present

## 2017-12-13 DIAGNOSIS — Z23 Encounter for immunization: Secondary | ICD-10-CM | POA: Diagnosis not present

## 2017-12-13 DIAGNOSIS — S3991XA Unspecified injury of abdomen, initial encounter: Secondary | ICD-10-CM | POA: Diagnosis not present

## 2017-12-13 DIAGNOSIS — S0990XA Unspecified injury of head, initial encounter: Secondary | ICD-10-CM | POA: Diagnosis not present

## 2017-12-13 DIAGNOSIS — S299XXA Unspecified injury of thorax, initial encounter: Secondary | ICD-10-CM | POA: Diagnosis not present

## 2017-12-13 DIAGNOSIS — Z7984 Long term (current) use of oral hypoglycemic drugs: Secondary | ICD-10-CM

## 2017-12-13 DIAGNOSIS — S199XXA Unspecified injury of neck, initial encounter: Secondary | ICD-10-CM | POA: Diagnosis not present

## 2017-12-13 DIAGNOSIS — J939 Pneumothorax, unspecified: Secondary | ICD-10-CM

## 2017-12-13 DIAGNOSIS — R739 Hyperglycemia, unspecified: Secondary | ICD-10-CM | POA: Diagnosis present

## 2017-12-13 DIAGNOSIS — S59901A Unspecified injury of right elbow, initial encounter: Secondary | ICD-10-CM | POA: Diagnosis not present

## 2017-12-13 DIAGNOSIS — E119 Type 2 diabetes mellitus without complications: Secondary | ICD-10-CM | POA: Diagnosis not present

## 2017-12-13 DIAGNOSIS — R102 Pelvic and perineal pain: Secondary | ICD-10-CM | POA: Diagnosis not present

## 2017-12-13 DIAGNOSIS — S2249XA Multiple fractures of ribs, unspecified side, initial encounter for closed fracture: Secondary | ICD-10-CM | POA: Diagnosis present

## 2017-12-13 DIAGNOSIS — M25521 Pain in right elbow: Secondary | ICD-10-CM | POA: Diagnosis not present

## 2017-12-13 LAB — URINALYSIS, ROUTINE W REFLEX MICROSCOPIC
BACTERIA UA: NONE SEEN
BILIRUBIN URINE: NEGATIVE
Glucose, UA: >=500 mg/dL — AB
KETONES UR: 5 mg/dL — AB
LEUKOCYTES UA: NEGATIVE
NITRITE: NEGATIVE
PROTEIN: NEGATIVE mg/dL
Specific Gravity, Urine: 1.018 (ref 1.005–1.030)
pH: 6 (ref 5.0–8.0)

## 2017-12-13 LAB — CBC
HEMATOCRIT: 42.4 % (ref 39.0–52.0)
HEMOGLOBIN: 13.3 g/dL (ref 13.0–17.0)
MCH: 25.6 pg — ABNORMAL LOW (ref 26.0–34.0)
MCHC: 31.4 g/dL (ref 30.0–36.0)
MCV: 81.7 fL (ref 80.0–100.0)
NRBC: 0 % (ref 0.0–0.2)
Platelets: 268 10*3/uL (ref 150–400)
RBC: 5.19 MIL/uL (ref 4.22–5.81)
RDW: 15.5 % (ref 11.5–15.5)
WBC: 10.2 10*3/uL (ref 4.0–10.5)

## 2017-12-13 LAB — BASIC METABOLIC PANEL
Anion gap: 12 (ref 5–15)
BUN: 11 mg/dL (ref 8–23)
CHLORIDE: 101 mmol/L (ref 98–111)
CO2: 24 mmol/L (ref 22–32)
CREATININE: 0.85 mg/dL (ref 0.61–1.24)
Calcium: 9 mg/dL (ref 8.9–10.3)
GFR calc Af Amer: >60 mL/min (ref >60)
GLUCOSE: 244 mg/dL — AB (ref 70–99)
Potassium: 4.1 mmol/L (ref 3.5–5.1)
SODIUM: 137 mmol/L (ref 135–145)

## 2017-12-13 LAB — I-STAT TROPONIN, ED: TROPONIN I, POC: 0 ng/mL (ref 0.00–0.08)

## 2017-12-13 LAB — SAMPLE TO BLOOD BANK

## 2017-12-13 MED ORDER — SODIUM CHLORIDE 0.9 % IV BOLUS
1000.0000 mL | Freq: Once | INTRAVENOUS | Status: AC
  Administered 2017-12-13: 1000 mL via INTRAVENOUS

## 2017-12-13 MED ORDER — IOPAMIDOL (ISOVUE-300) INJECTION 61%
INTRAVENOUS | Status: AC
  Filled 2017-12-13: qty 100

## 2017-12-13 MED ORDER — MORPHINE SULFATE (PF) 4 MG/ML IV SOLN
4.0000 mg | Freq: Once | INTRAVENOUS | Status: AC
  Administered 2017-12-13: 4 mg via INTRAVENOUS
  Filled 2017-12-13: qty 1

## 2017-12-13 MED ORDER — IOPAMIDOL (ISOVUE-300) INJECTION 61%
100.0000 mL | Freq: Once | INTRAVENOUS | Status: AC | PRN
  Administered 2017-12-13: 100 mL via INTRAVENOUS

## 2017-12-13 MED ORDER — HYDROMORPHONE HCL 1 MG/ML IJ SOLN
0.5000 mg | Freq: Once | INTRAMUSCULAR | Status: AC
  Administered 2017-12-13: 0.5 mg via INTRAVENOUS
  Filled 2017-12-13: qty 1

## 2017-12-13 MED ORDER — SODIUM CHLORIDE 0.9 % IJ SOLN
INTRAMUSCULAR | Status: AC
  Filled 2017-12-13: qty 50

## 2017-12-13 MED ORDER — TETANUS-DIPHTH-ACELL PERTUSSIS 5-2.5-18.5 LF-MCG/0.5 IM SUSP
0.5000 mL | Freq: Once | INTRAMUSCULAR | Status: AC
  Administered 2017-12-13: 0.5 mL via INTRAMUSCULAR
  Filled 2017-12-13: qty 0.5

## 2017-12-13 MED ORDER — SODIUM CHLORIDE 0.9 % IV SOLN
INTRAVENOUS | Status: DC
  Administered 2017-12-13: 22:00:00 via INTRAVENOUS

## 2017-12-13 NOTE — ED Notes (Signed)
Report attempted to receiving RN. This RN was requested to call back.

## 2017-12-13 NOTE — ED Triage Notes (Addendum)
Patient reports fall approximately five feet off ladder yesterday, landing on right hip. C/o sternum pain radiating to ribs and back. Denies head injury and LOC. Pain worsens with movement. Denies neck pain.

## 2017-12-13 NOTE — Progress Notes (Signed)
ED TO INPATIENT HANDOFF REPORT  Name/Age/Gender Jacob Macdonald 72 y.o. male  Code Status   Home/SNF/Other Home  Chief Complaint fall  Level of Care/Admitting Diagnosis ED Disposition    ED Disposition Condition [facility]: [place] [[id number]]  Level of Care: Med-Surg [16]  Diagnosis: Multiple rib fractures [496759]  Admitting Physician: Donnie Mesa [[id number]]  Attending Physician: Donnie Mesa [[id number]]  PT Class (Do Not Modify): Observation [104]  PT Acc Code (Do Not Modify): Observation [10022]       Medical History Past Medical History:  Diagnosis Date  . Diabetes mellitus without complication ([place])   . Distal radius fracture, left   . GERD (gastroesophageal reflux disease)   . History of hiatal hernia   . Hypertension     Allergies Allergies  Allergen Reactions  . Lisinopril Swelling    REACTION: swelling  . Penicillins Other (See Comments)    REACTION: swelling Has patient had a PCN reaction causing immediate rash, facial/tongue/throat swelling, SOB or lightheadedness with hypotension: unknown Has patient had a PCN reaction causing severe rash involving mucus membranes or skin necrosis: unknown Has patient had a PCN reaction that required hospitalization unknown Has patient had a PCN reaction occurring within the last 10 years: unknown If all of the above answers are "NO", then may proceed with Cephalosporin use.   . Shellfish Allergy Nausea And Vomiting         IV Location/Drains/Wounds Patient Lines/Drains/Airways Status   Active Line/Drains/Airways    Name:   Placement date:   Placement time:   Site:   Days:   Peripheral IV 12/13/17 Left Antecubital   12/13/17    1817    Antecubital   less than 1   Incision (Closed) 08/03/15 Wrist Left   08/03/15    0857     863          Labs/Imaging Results for orders placed or performed during the hospital encounter of 12/13/17 (from the past 48 hour(s))  CBC      Status: Abnormal   Collection Time: 12/13/17  6:13 PM  Result Value Ref Range   WBC 10.2 4.0 - 10.5 K/uL   RBC 5.19 4.22 - 5.81 MIL/uL   Hemoglobin 13.3 13.0 - 17.0 g/dL   HCT 42.4 39.0 - 52.0 %   MCV 81.7 80.0 - 100.0 fL   MCH 25.6 (L) 26.0 - 34.0 pg   MCHC 31.4 30.0 - 36.0 g/dL   RDW 15.5 11.5 - 15.5 %   Platelets 268 150 - 400 K/uL   nRBC 0.0 0.0 - 0.2 %    Comment: Performed at [facility], [place] [address] [place], [place] [address]  Sample to Blood Bank     Status: None   Collection Time: 12/13/17  6:13 PM  Result Value Ref Range   Blood Bank Specimen SAMPLE AVAILABLE FOR TESTING    Sample Expiration      12/16/2017 Performed at [facility], [place] [address] [place], [place] [address]   Basic metabolic panel     Status: Abnormal   Collection Time: 12/13/17  6:13 PM  Result Value Ref Range   Sodium 137 135 - 145 mmol/L   Potassium 4.1 3.5 - 5.1 mmol/L   Chloride 101 98 - 111 mmol/L   CO2 24 22 - 32 mmol/L   Glucose, Bld 244 (H) 70 - 99 mg/dL   BUN 11 8 - 23 mg/dL  Creatinine, Ser 0.85 0.61 - 1.24 mg/dL   Calcium 9.0 8.9 - 10.3 mg/dL   GFR calc non Af Amer >60 >60 mL/min   GFR calc Af Amer >60 >60 mL/min    Comment: (NOTE) The eGFR has been calculated using the CKD EPI equation. This calculation has not been validated in all clinical situations. eGFR's persistently <60 mL/min signify possible Chronic Kidney Disease.    Anion gap 12 5 - 15    Comment: Performed at [facility], [place] [address], [place], [place] [address]  I-Stat Troponin, ED     Status: None   Collection Time: 12/13/17  6:27 PM  Result Value Ref Range   Troponin i, poc 0.00 0.00 - 0.08 ng/mL   Comment 3            Comment: Due to the release kinetics of cTnI, a negative result within the first hours of the onset of symptoms does not rule out myocardial infarction with certainty. If myocardial infarction is still suspected, repeat the test  at appropriate intervals.   Urinalysis, Routine w reflex microscopic     Status: Abnormal   Collection Time: 12/13/17  6:49 PM  Result Value Ref Range   Color, Urine YELLOW YELLOW   APPearance CLEAR CLEAR   Specific Gravity, Urine 1.018 1.005 - 1.030   pH 6.0 5.0 - 8.0   Glucose, UA >=500 (A) NEGATIVE mg/dL   Hgb urine dipstick SMALL (A) NEGATIVE   Bilirubin Urine NEGATIVE NEGATIVE   Ketones, ur 5 (A) NEGATIVE mg/dL   Protein, ur NEGATIVE NEGATIVE mg/dL   Nitrite NEGATIVE NEGATIVE   Leukocytes, UA NEGATIVE NEGATIVE   RBC / HPF 6-10 0 - 5 RBC/hpf   WBC, UA 0-5 0 - 5 WBC/hpf   Bacteria, UA NONE SEEN NONE SEEN   Mucus PRESENT     Comment: Performed at [facility], [place] [address], [place], [place] [address]   Dg Chest 2 View  Result Date: 12/13/2017 CLINICAL DATA:  Fall yesterday EXAM: CHEST - 2 VIEW COMPARISON:  07/24/2015 FINDINGS: Lungs are under aerated with bibasilar atelectasis. The heart is normal in size. No pneumothorax or pleural effusion. No definite acute rib fracture. IMPRESSION: Bibasilar atelectasis. Electronically Signed   By: Marybelle Killings M.D.   On: 12/13/2017 19:21   Dg Pelvis 1-2 Views  Result Date: 12/13/2017 CLINICAL DATA:  Patient fell 5 feet off ladder landing on right hip. Pain. EXAM: PELVIS - 1-2 VIEW COMPARISON:  None. FINDINGS: There is no evidence of pelvic fracture or diastasis. No pelvic bone lesions are seen. Lower lumbar facet arthropathy seen at L5-S1 on the right. Both hip joints are maintained with slight joint space narrowing bilaterally. No acute fracture of the bony pelvis and either hip. IMPRESSION: No acute osseous abnormality of the pelvis and included hips. Lower lumbar facet arthropathy L5-S1 on the right. Mild degenerative joint space narrowing of both hips. Electronically Signed   By: Ashley Royalty M.D.   On: 12/13/2017 19:22   Ct Head Wo Contrast  Result Date: 12/13/2017 CLINICAL DATA:  Fall from ladder with back pain.   Initial encounter. EXAM: CT HEAD WITHOUT CONTRAST CT CERVICAL SPINE WITHOUT CONTRAST TECHNIQUE: Multidetector CT imaging of the head and cervical spine was performed following the standard protocol without intravenous contrast. Multiplanar CT image reconstructions of the cervical spine were also generated. COMPARISON:  07/24/2015 FINDINGS: CT HEAD FINDINGS Brain: No evidence of acute infarction, hemorrhage, hydrocephalus, extra-axial collection or mass lesion/mass  effect. Remote lacunar infarct in the left thalamus, also seen in 2017. Mild cerebral volume loss that is generalized Vascular: No hyperdense vessel or unexpected calcification. Skull: Negative for fracture.  Patchy scalp scarring. Sinuses/Orbits: No evidence of injury CT CERVICAL SPINE FINDINGS Alignment: Normal Skull base and vertebrae: Negative for fracture Soft tissues and spinal canal: No prevertebral fluid or swelling. No visible canal hematoma. Disc levels: Spondylosis. No evidence of degenerative cord impingement Upper chest: Reported separately IMPRESSION: No evidence of intracranial or cervical spine injury. Electronically Signed   By: Monte Fantasia M.D.   On: 12/13/2017 20:17   Ct Chest W Contrast  Result Date: 12/13/2017 CLINICAL DATA:  Fall from ladder yesterday. EXAM: CT CHEST, ABDOMEN, AND PELVIS WITH CONTRAST TECHNIQUE: Multidetector CT imaging of the chest, abdomen and pelvis was performed following the standard protocol during bolus administration of intravenous contrast. CONTRAST:  138m ISOVUE-300 IOPAMIDOL (ISOVUE-300) INJECTION 61% COMPARISON:  None. FINDINGS: CT CHEST FINDINGS Cardiovascular: Atherosclerotic calcifications of the aorta are noted. No evidence of aortic rupture or aneurysm. No evidence of dissection or intramural hematoma. Mild atherosclerotic changes of the proximal great vessels are present. Mild 3 vessel coronary artery calcifications. Mediastinum/Nodes: No abnormal mediastinal adenopathy. No pericardial  effusion. No evidence of mediastinal hemorrhage. Lungs/Pleura: No pneumothorax. Small left pleural effusion. Dependent atelectasis bilaterally. Musculoskeletal: No vertebral compression deformity. Acute fractures of the lateral left third, 4, fifth, sixth, seventh, and eighth ribs are noted. CT ABDOMEN PELVIS FINDINGS Hepatobiliary: Unremarkable Pancreas: Unremarkable Spleen: Unremarkable Adrenals/Urinary Tract: Adrenal glands are within normal limits. Kidneys are unremarkable. Bladder is distended. Stomach/Bowel: No obvious mass in the colon. No evidence of small-bowel obstruction. Unremarkable stomach. Vascular/Lymphatic: Aortic atherosclerotic calcifications and predominately soft plaque. Atherosclerotic changes of the iliac arteries are also noted. No abnormal retroperitoneal adenopathy. Reproductive: Prostate is enlarged. Other: No free fluid.  No evidence of hemoperitoneum. Musculoskeletal: No vertebral compression deformity. IMPRESSION: Multiple minimally displaced fractures of the left third, fourth, fifth, 6, seventh, and eighth ribs. No pneumothorax. No evidence of organ injury in the abdomen or pelvis. Bladder distention. Electronically Signed   By: AMarybelle KillingsM.D.   On: 12/13/2017 20:23   Ct Cervical Spine Wo Contrast  Result Date: 12/13/2017 CLINICAL DATA:  Fall from ladder with back pain.  Initial encounter. EXAM: CT HEAD WITHOUT CONTRAST CT CERVICAL SPINE WITHOUT CONTRAST TECHNIQUE: Multidetector CT imaging of the head and cervical spine was performed following the standard protocol without intravenous contrast. Multiplanar CT image reconstructions of the cervical spine were also generated. COMPARISON:  07/24/2015 FINDINGS: CT HEAD FINDINGS Brain: No evidence of acute infarction, hemorrhage, hydrocephalus, extra-axial collection or mass lesion/mass effect. Remote lacunar infarct in the left thalamus, also seen in 2017. Mild cerebral volume loss that is generalized Vascular: No hyperdense vessel  or unexpected calcification. Skull: Negative for fracture.  Patchy scalp scarring. Sinuses/Orbits: No evidence of injury CT CERVICAL SPINE FINDINGS Alignment: Normal Skull base and vertebrae: Negative for fracture Soft tissues and spinal canal: No prevertebral fluid or swelling. No visible canal hematoma. Disc levels: Spondylosis. No evidence of degenerative cord impingement Upper chest: Reported separately IMPRESSION: No evidence of intracranial or cervical spine injury. Electronically Signed   By: JMonte FantasiaM.D.   On: 12/13/2017 20:17   Ct Abdomen Pelvis W Contrast  Result Date: 12/13/2017 CLINICAL DATA:  Fall from ladder yesterday. EXAM: CT CHEST, ABDOMEN, AND PELVIS WITH CONTRAST TECHNIQUE: Multidetector CT imaging of the chest, abdomen and pelvis was performed following the standard protocol during bolus administration of  intravenous contrast. CONTRAST:  135m ISOVUE-300 IOPAMIDOL (ISOVUE-300) INJECTION 61% COMPARISON:  None. FINDINGS: CT CHEST FINDINGS Cardiovascular: Atherosclerotic calcifications of the aorta are noted. No evidence of aortic rupture or aneurysm. No evidence of dissection or intramural hematoma. Mild atherosclerotic changes of the proximal great vessels are present. Mild 3 vessel coronary artery calcifications. Mediastinum/Nodes: No abnormal mediastinal adenopathy. No pericardial effusion. No evidence of mediastinal hemorrhage. Lungs/Pleura: No pneumothorax. Small left pleural effusion. Dependent atelectasis bilaterally. Musculoskeletal: No vertebral compression deformity. Acute fractures of the lateral left third, 4, fifth, sixth, seventh, and eighth ribs are noted. CT ABDOMEN PELVIS FINDINGS Hepatobiliary: Unremarkable Pancreas: Unremarkable Spleen: Unremarkable Adrenals/Urinary Tract: Adrenal glands are within normal limits. Kidneys are unremarkable. Bladder is distended. Stomach/Bowel: No obvious mass in the colon. No evidence of small-bowel obstruction. Unremarkable stomach.  Vascular/Lymphatic: Aortic atherosclerotic calcifications and predominately soft plaque. Atherosclerotic changes of the iliac arteries are also noted. No abnormal retroperitoneal adenopathy. Reproductive: Prostate is enlarged. Other: No free fluid.  No evidence of hemoperitoneum. Musculoskeletal: No vertebral compression deformity. IMPRESSION: Multiple minimally displaced fractures of the left third, fourth, fifth, 6, seventh, and eighth ribs. No pneumothorax. No evidence of organ injury in the abdomen or pelvis. Bladder distention. Electronically Signed   By: AMarybelle KillingsM.D.   On: 12/13/2017 20:23    Pending Labs Unresulted Labs (From admission, onward)   None      Vitals/Pain Today's Vitals   12/13/17 1956 12/13/17 2000 12/13/17 2040 12/13/17 2059  BP:    (!) 177/67  Pulse:  92  81  Resp:  16  19  Temp:      TempSrc:      SpO2:  95%  96%  PainSc: 10-Worst pain ever  10-Worst pain ever     Isolation Precautions No active isolations  Medications Medications  sodium chloride 0.9 % bolus 1,000 mL (0 mLs Intravenous Stopped 12/13/17 1918)    And  0.9 %  sodium chloride infusion ( Intravenous New Bag/Given 12/13/17 2135)  iopamidol (ISOVUE-300) 61 % injection (has no administration in time range)  sodium chloride 0.9 % injection (has no administration in time range)  morphine 4 MG/ML injection 4 mg (4 mg Intravenous Given 12/13/17 1814)  Tdap (BOOSTRIX) injection 0.5 mL (0.5 mLs Intramuscular Given 12/13/17 1801)  HYDROmorphone (DILAUDID) injection 0.5 mg (0.5 mg Intravenous Given 12/13/17 1955)  iopamidol (ISOVUE-300) 61 % injection 100 mL (100 mLs Intravenous Contrast Given 12/13/17 1932)  HYDROmorphone (DILAUDID) injection 0.5 mg (0.5 mg Intravenous Given 12/13/17 2130)    Mobility walks

## 2017-12-13 NOTE — ED Provider Notes (Addendum)
[facility]-EMERGENCY DEPT Provider Note   CSN: [id number] Arrival date & time: 12/13/17  1652     History   Chief Complaint Chief Complaint  Patient presents with  . Fall    HPI Jacob Macdonald is a 72 y.o. male.  HPI   Patient is a 72 year old male with a history of type 2 diabetes mellitus, GERD, hypertension presenting for fall.  Patient reports that yesterday around 3 PM, the latter he was working on gave out below him, and he fell approximately 5 feet onto his right side.  Patient reports that he "had the wind knocked out of him" for several minutes before he was able to get up.  Patient reports he has been ambulatory over the past 24 hours without difficulty, but has had increasing pleuritic pain over the left side of his chest.  Patient reports that he does not want to move the left side of his body due to the pain.  Patient reports that he felt "chilled" this morning, denies productive cough or hemoptysis.  Patient reports that the left flank is also painful.  Patient has not noticed any gross hematuria.  Patient takes 81 mg of aspirin daily.  Patient took his home dose of Norco, which he reports did not improve the pain.  Past Medical History:  Diagnosis Date  . Diabetes mellitus without complication (HCC)   . Distal radius fracture, left   . GERD (gastroesophageal reflux disease)   . History of hiatal hernia   . Hypertension     There are no active problems to display for this patient.   Past Surgical History:  Procedure Laterality Date  . HERNIA REPAIR    . OPEN REDUCTION INTERNAL FIXATION (ORIF) DISTAL RADIAL FRACTURE Left 08/03/2015   Procedure: OPEN TREATMENT OF LEFT DISTAL RADIUS FRACTURE;  Surgeon: Mack Hook, MD;  Location: [facility];  Service: Orthopedics;  Laterality: Left;  GENERAL ANESTHESIA WITH PRE-OP BLOCK  . SHOULDER ARTHROSCOPY Right         Home Medications    Prior to Admission medications     Medication Sig Start Date End Date Taking? Authorizing Provider  glipiZIDE (GLUCOTROL) 10 MG tablet Take 10 mg by mouth 2 (two) times daily before a meal.   Yes [provider]  metFORMIN (GLUCOPHAGE-XR) 500 MG 24 hr tablet Take 1,000 mg by mouth 2 (two) times daily.   Yes [provider]  omeprazole (PRILOSEC) 20 MG capsule Take 20 mg by mouth daily. 06/09/15  Yes [provider]  simvastatin (ZOCOR) 80 MG tablet Take 40 mg by mouth at bedtime. 06/06/15  Yes [provider]  amLODipine (NORVASC) 5 MG tablet Take 5 mg by mouth daily.    [provider]  clonazePAM (KLONOPIN) 1 MG tablet Take 1 mg by mouth at bedtime.  07/23/15   [provider]  HYDROcodone-acetaminophen (NORCO/VICODIN) 5-325 MG tablet Take 1 tablet by mouth 2 (two) times daily. Reported on 07/24/2015 07/16/15   [provider]  oxyCODONE-acetaminophen (PERCOCET/ROXICET) 5-325 MG tablet Take 1-2 tablets by mouth every 6 (six) hours as needed for severe pain. 08/03/15   Mack Hook, MD    Family History No family history on file.  Social History Social History   Tobacco Use  . Smoking status: Current Every Day Smoker    Packs/day: 1.00    Types: Cigarettes  Substance Use Topics  . Alcohol use: No  . Drug use: No     Allergies  Lisinopril; Penicillins; and Shellfish allergy   Review of Systems Review of Systems  Constitutional: Negative for chills and fever.  HENT: Negative for congestion and sore throat.   Eyes: Negative for visual disturbance.  Respiratory: Positive for shortness of breath. Negative for cough and chest tightness.   Cardiovascular: Positive for chest pain. Negative for palpitations and leg swelling.  Gastrointestinal: Negative for abdominal pain, nausea and vomiting.  Genitourinary: Negative for dysuria and flank pain.  Musculoskeletal: Positive for arthralgias, back pain, myalgias and neck pain. Negative for gait problem and neck  stiffness.  Skin: Negative for rash.  Neurological: Negative for dizziness, syncope, light-headedness and headaches.     Physical Exam Updated Vital Signs BP (!) 159/81   Pulse 81   Temp 97.9 F (36.6 C) (Oral)   Resp (!) 21   SpO2 94%   Physical Exam  Constitutional: He appears well-developed and well-nourished. No distress.  HENT:  Head: Normocephalic and atraumatic.  Mouth/Throat: Oropharynx is clear and moist.  Eyes: Pupils are equal, round, and reactive to light. Conjunctivae and EOM are normal.  Neck: Normal range of motion. Neck supple.  Cardiovascular: Normal rate, regular rhythm, S1 normal and S2 normal.  No murmur heard. Pulmonary/Chest: Effort normal and breath sounds normal. He has no wheezes. He has no rales.  Abdominal: Soft. He exhibits no distension. There is tenderness.  Left-sided abdominal tenderness. No guarding or rebound.   Musculoskeletal: He exhibits no edema or deformity.  No midline tenderness of cervical, thoracic, or lumbar spine. Patient has diffuse left flank tenderness palpation over lower ribs.  Neurological: He is alert.  Cranial nerves grossly intact. Patient moves extremities symmetrically and with good coordination.  Skin: Skin is warm and dry. No rash noted. No erythema.  Abrasions noted over right posterior elbow.  Psychiatric: He has a normal mood and affect. His behavior is normal. Judgment and thought content normal.  Nursing note and vitals reviewed.    ED Treatments / Results  Labs (all labs ordered are listed, but only abnormal results are displayed) Labs Reviewed  CBC - Abnormal; Notable for the following components:      Result Value   MCH 25.6 (*)    All other components within normal limits  BASIC METABOLIC PANEL - Abnormal; Notable for the following components:   Glucose, Bld 244 (*)    All other components within normal limits  URINALYSIS, ROUTINE W REFLEX MICROSCOPIC  I-STAT TROPONIN, ED  SAMPLE TO BLOOD BANK     EKG EKG Interpretation  Date/Time:  Wednesday December 13 2017 18:00:00 EDT Ventricular Rate:  84 PR Interval:    QRS Duration: 71 QT Interval:  359 QTC Calculation: 425 R Axis:   66 Text Interpretation:  Sinus rhythm No STEMI  Confirmed by Alona Bene [phone]) on 12/13/2017 6:03:02 PM   Radiology Dg Chest 2 View  Result Date: 12/13/2017 CLINICAL DATA:  Fall yesterday EXAM: CHEST - 2 VIEW COMPARISON:  07/24/2015 FINDINGS: Lungs are under aerated with bibasilar atelectasis. The heart is normal in size. No pneumothorax or pleural effusion. No definite acute rib fracture. IMPRESSION: Bibasilar atelectasis. Electronically Signed   By: Jolaine Click M.D.   On: 12/13/2017 19:21   Dg Pelvis 1-2 Views  Result Date: 12/13/2017 CLINICAL DATA:  Patient fell 5 feet off ladder landing on right hip. Pain. EXAM: PELVIS - 1-2 VIEW COMPARISON:  None. FINDINGS: There is no evidence of pelvic fracture or diastasis. No pelvic bone lesions are seen. Lower lumbar facet arthropathy seen  at L5-S1 on the right. Both hip joints are maintained with slight joint space narrowing bilaterally. No acute fracture of the bony pelvis and either hip. IMPRESSION: No acute osseous abnormality of the pelvis and included hips. Lower lumbar facet arthropathy L5-S1 on the right. Mild degenerative joint space narrowing of both hips. Electronically Signed   By: Tollie Eth M.D.   On: 12/13/2017 19:22   Ct Head Wo Contrast  Result Date: 12/13/2017 CLINICAL DATA:  Fall from ladder with back pain.  Initial encounter. EXAM: CT HEAD WITHOUT CONTRAST CT CERVICAL SPINE WITHOUT CONTRAST TECHNIQUE: Multidetector CT imaging of the head and cervical spine was performed following the standard protocol without intravenous contrast. Multiplanar CT image reconstructions of the cervical spine were also generated. COMPARISON:  07/24/2015 FINDINGS: CT HEAD FINDINGS Brain: No evidence of acute infarction, hemorrhage, hydrocephalus, extra-axial  collection or mass lesion/mass effect. Remote lacunar infarct in the left thalamus, also seen in 2017. Mild cerebral volume loss that is generalized Vascular: No hyperdense vessel or unexpected calcification. Skull: Negative for fracture.  Patchy scalp scarring. Sinuses/Orbits: No evidence of injury CT CERVICAL SPINE FINDINGS Alignment: Normal Skull base and vertebrae: Negative for fracture Soft tissues and spinal canal: No prevertebral fluid or swelling. No visible canal hematoma. Disc levels: Spondylosis. No evidence of degenerative cord impingement Upper chest: Reported separately IMPRESSION: No evidence of intracranial or cervical spine injury. Electronically Signed   By: Marnee Spring M.D.   On: 12/13/2017 20:17   Ct Chest W Contrast  Result Date: 12/13/2017 CLINICAL DATA:  Fall from ladder yesterday. EXAM: CT CHEST, ABDOMEN, AND PELVIS WITH CONTRAST TECHNIQUE: Multidetector CT imaging of the chest, abdomen and pelvis was performed following the standard protocol during bolus administration of intravenous contrast. CONTRAST:  ISOVUE-300 IOPAMIDOL (ISOVUE-300) INJECTION 61% COMPARISON:  None. FINDINGS: CT CHEST FINDINGS Cardiovascular: Atherosclerotic calcifications of the aorta are noted. No evidence of aortic rupture or aneurysm. No evidence of dissection or intramural hematoma. Mild atherosclerotic changes of the proximal great vessels are present. Mild 3 vessel coronary artery calcifications. Mediastinum/Nodes: No abnormal mediastinal adenopathy. No pericardial effusion. No evidence of mediastinal hemorrhage. Lungs/Pleura: No pneumothorax. Small left pleural effusion. Dependent atelectasis bilaterally. Musculoskeletal: No vertebral compression deformity. Acute fractures of the lateral left third, 4, fifth, sixth, seventh, and eighth ribs are noted. CT ABDOMEN PELVIS FINDINGS Hepatobiliary: Unremarkable Pancreas: Unremarkable Spleen: Unremarkable Adrenals/Urinary Tract: Adrenal glands are within  normal limits. Kidneys are unremarkable. Bladder is distended. Stomach/Bowel: No obvious mass in the colon. No evidence of small-bowel obstruction. Unremarkable stomach. Vascular/Lymphatic: Aortic atherosclerotic calcifications and predominately soft plaque. Atherosclerotic changes of the iliac arteries are also noted. No abnormal retroperitoneal adenopathy. Reproductive: Prostate is enlarged. Other: No free fluid.  No evidence of hemoperitoneum. Musculoskeletal: No vertebral compression deformity. IMPRESSION: Multiple minimally displaced fractures of the left third, fourth, fifth, 6, seventh, and eighth ribs. No pneumothorax. No evidence of organ injury in the abdomen or pelvis. Bladder distention. Electronically Signed   By: Jolaine Click M.D.   On: 12/13/2017 20:23   Ct Cervical Spine Wo Contrast  Result Date: 12/13/2017 CLINICAL DATA:  Fall from ladder with back pain.  Initial encounter. EXAM: CT HEAD WITHOUT CONTRAST CT CERVICAL SPINE WITHOUT CONTRAST TECHNIQUE: Multidetector CT imaging of the head and cervical spine was performed following the standard protocol without intravenous contrast. Multiplanar CT image reconstructions of the cervical spine were also generated. COMPARISON:  07/24/2015 FINDINGS: CT HEAD FINDINGS Brain: No evidence of acute infarction, hemorrhage, hydrocephalus, extra-axial collection or mass  lesion/mass effect. Remote lacunar infarct in the left thalamus, also seen in 2017. Mild cerebral volume loss that is generalized Vascular: No hyperdense vessel or unexpected calcification. Skull: Negative for fracture.  Patchy scalp scarring. Sinuses/Orbits: No evidence of injury CT CERVICAL SPINE FINDINGS Alignment: Normal Skull base and vertebrae: Negative for fracture Soft tissues and spinal canal: No prevertebral fluid or swelling. No visible canal hematoma. Disc levels: Spondylosis. No evidence of degenerative cord impingement Upper chest: Reported separately IMPRESSION: No evidence of  intracranial or cervical spine injury. Electronically Signed   By: Marnee Spring M.D.   On: 12/13/2017 20:17   Ct Abdomen Pelvis W Contrast  Result Date: 12/13/2017 CLINICAL DATA:  Fall from ladder yesterday. EXAM: CT CHEST, ABDOMEN, AND PELVIS WITH CONTRAST TECHNIQUE: Multidetector CT imaging of the chest, abdomen and pelvis was performed following the standard protocol during bolus administration of intravenous contrast. CONTRAST:  ISOVUE-300 IOPAMIDOL (ISOVUE-300) INJECTION 61% COMPARISON:  None. FINDINGS: CT CHEST FINDINGS Cardiovascular: Atherosclerotic calcifications of the aorta are noted. No evidence of aortic rupture or aneurysm. No evidence of dissection or intramural hematoma. Mild atherosclerotic changes of the proximal great vessels are present. Mild 3 vessel coronary artery calcifications. Mediastinum/Nodes: No abnormal mediastinal adenopathy. No pericardial effusion. No evidence of mediastinal hemorrhage. Lungs/Pleura: No pneumothorax. Small left pleural effusion. Dependent atelectasis bilaterally. Musculoskeletal: No vertebral compression deformity. Acute fractures of the lateral left third, 4, fifth, sixth, seventh, and eighth ribs are noted. CT ABDOMEN PELVIS FINDINGS Hepatobiliary: Unremarkable Pancreas: Unremarkable Spleen: Unremarkable Adrenals/Urinary Tract: Adrenal glands are within normal limits. Kidneys are unremarkable. Bladder is distended. Stomach/Bowel: No obvious mass in the colon. No evidence of small-bowel obstruction. Unremarkable stomach. Vascular/Lymphatic: Aortic atherosclerotic calcifications and predominately soft plaque. Atherosclerotic changes of the iliac arteries are also noted. No abnormal retroperitoneal adenopathy. Reproductive: Prostate is enlarged. Other: No free fluid.  No evidence of hemoperitoneum. Musculoskeletal: No vertebral compression deformity. IMPRESSION: Multiple minimally displaced fractures of the left third, fourth, fifth, 6, seventh, and  eighth ribs. No pneumothorax. No evidence of organ injury in the abdomen or pelvis. Bladder distention. Electronically Signed   By: Jolaine Click M.D.   On: 12/13/2017 20:23    Procedures Procedures (including critical care time)  Medications Ordered in ED Medications  sodium chloride 0.9 % bolus 1,000 mL (1,000 mLs Intravenous New Bag/Given 12/13/17 1817)    And  0.9 %  sodium chloride infusion (has no administration in time range)  HYDROmorphone (DILAUDID) injection 0.5 mg (has no administration in time range)  morphine 4 MG/ML injection 4 mg (4 mg Intravenous Given 12/13/17 1814)  Tdap (BOOSTRIX) injection 0.5 mL (0.5 mLs Intramuscular Given 12/13/17 1801)     Initial Impression / Assessment and Plan / ED Course  I have reviewed the triage vital signs and the nursing notes.  Pertinent labs & imaging results that were available during my care of the patient were reviewed by me and considered in my medical decision making (see chart for details).  Clinical Course as of Dec 15 27  Wed Dec 13, 2017  1915 Reassessed.  Patient reports no improvement in pain after morphine.   [AM]  2028 Six minimally displaced fractures. Will seek admission for observation and pain control.  CT CHEST W CONTRAST [AM]  2219 No evidence on CT of renal injury.  Hgb urine dipstick(!): SMALL [AM]  2220 Pt with history of type 2 DM.  Glucose(!): 244 [AM]  2316 Verified that patient will be on pulse oximetry prior to transfer for  pain control.   [AM]    Clinical Course User Index [AM] Elisha Ponder, PA-C    Patient is hemodynamically stable but uncomfortable appearing.  Patient taking shallow breaths due to the pain on the left side.  After diagnosis includes traumatic pneumothorax, multiple rib fractures, pulmonary contusion, renal injury, splenic injury.  Given the possibility of distracting injury and age, as well as anticoagulation 81 mg of aspirin, will scan patient's head, cervical spine, chest,  and abdomen pelvis.  Preliminary review of patient's chest and pelvis x-rays demonstrate no evidence of obvious pneumothorax or rib fracture.  No pelvic abnormalities.  Patient had minimal pain relief with morphine, but did have pain relief with incremental doses of Dilaudid.  Case was discussed with Dr. Corliss Skains who accepts patient onto trauma service.  I appreciate his involvement in the care of this patient.  This is a shared visit with Dr. Tilden Fossa. Patient was independently evaluated by this attending physician. Attending physician consulted in evaluation and admission management.  Final Clinical Impressions(s) / ED Diagnoses   Final diagnoses:  Closed fracture of multiple ribs of left side, initial encounter  Hyperglycemia    ED Discharge Orders    None         Delia Chimes 12/14/17 0030    Tilden Fossa, MD 12/17/17 [phone]

## 2017-12-14 ENCOUNTER — Observation Stay (HOSPITAL_COMMUNITY): Payer: PPO

## 2017-12-14 ENCOUNTER — Other Ambulatory Visit: Payer: Self-pay

## 2017-12-14 DIAGNOSIS — W11XXXA Fall on and from ladder, initial encounter: Secondary | ICD-10-CM | POA: Diagnosis present

## 2017-12-14 DIAGNOSIS — Z7984 Long term (current) use of oral hypoglycemic drugs: Secondary | ICD-10-CM | POA: Diagnosis not present

## 2017-12-14 DIAGNOSIS — K219 Gastro-esophageal reflux disease without esophagitis: Secondary | ICD-10-CM | POA: Diagnosis present

## 2017-12-14 DIAGNOSIS — Z79899 Other long term (current) drug therapy: Secondary | ICD-10-CM | POA: Diagnosis not present

## 2017-12-14 DIAGNOSIS — Z7982 Long term (current) use of aspirin: Secondary | ICD-10-CM | POA: Diagnosis not present

## 2017-12-14 DIAGNOSIS — Z880 Allergy status to penicillin: Secondary | ICD-10-CM | POA: Diagnosis not present

## 2017-12-14 DIAGNOSIS — I10 Essential (primary) hypertension: Secondary | ICD-10-CM | POA: Diagnosis present

## 2017-12-14 DIAGNOSIS — E1165 Type 2 diabetes mellitus with hyperglycemia: Secondary | ICD-10-CM | POA: Diagnosis present

## 2017-12-14 DIAGNOSIS — S2232XA Fracture of one rib, left side, initial encounter for closed fracture: Secondary | ICD-10-CM | POA: Diagnosis not present

## 2017-12-14 DIAGNOSIS — F17210 Nicotine dependence, cigarettes, uncomplicated: Secondary | ICD-10-CM | POA: Diagnosis present

## 2017-12-14 DIAGNOSIS — S2242XA Multiple fractures of ribs, left side, initial encounter for closed fracture: Secondary | ICD-10-CM | POA: Diagnosis present

## 2017-12-14 DIAGNOSIS — Z888 Allergy status to other drugs, medicaments and biological substances status: Secondary | ICD-10-CM | POA: Diagnosis not present

## 2017-12-14 DIAGNOSIS — Z23 Encounter for immunization: Secondary | ICD-10-CM | POA: Diagnosis present

## 2017-12-14 DIAGNOSIS — R739 Hyperglycemia, unspecified: Secondary | ICD-10-CM | POA: Diagnosis present

## 2017-12-14 DIAGNOSIS — Z91013 Allergy to seafood: Secondary | ICD-10-CM | POA: Diagnosis not present

## 2017-12-14 LAB — GLUCOSE, CAPILLARY
GLUCOSE-CAPILLARY: 239 mg/dL — AB (ref 70–99)
GLUCOSE-CAPILLARY: 215 mg/dL — AB (ref 70–99)
Glucose-Capillary: 169 mg/dL — ABNORMAL HIGH (ref 70–99)

## 2017-12-14 MED ORDER — HYDROMORPHONE HCL 1 MG/ML IJ SOLN
1.0000 mg | INTRAMUSCULAR | Status: DC | PRN
  Administered 2017-12-14 (×3): 1 mg via INTRAVENOUS
  Filled 2017-12-14 (×3): qty 1

## 2017-12-14 MED ORDER — SODIUM CHLORIDE 0.9 % IV SOLN
INTRAVENOUS | Status: DC
  Administered 2017-12-14 (×2): via INTRAVENOUS

## 2017-12-14 MED ORDER — PANTOPRAZOLE SODIUM 40 MG PO TBEC
40.0000 mg | DELAYED_RELEASE_TABLET | Freq: Every day | ORAL | Status: DC
  Administered 2017-12-14 – 2017-12-15 (×2): 40 mg via ORAL
  Filled 2017-12-14 (×2): qty 1

## 2017-12-14 MED ORDER — BISACODYL 10 MG RE SUPP: 10.0000 mg | Freq: Every day | RECTAL | Status: DC | PRN

## 2017-12-14 MED ORDER — DOCUSATE SODIUM 100 MG PO CAPS
100.0000 mg | ORAL_CAPSULE | Freq: Two times a day (BID) | ORAL | Status: DC
  Administered 2017-12-14 – 2017-12-15 (×3): 100 mg via ORAL
  Filled 2017-12-14 (×3): qty 1

## 2017-12-14 MED ORDER — ACETAMINOPHEN 500 MG PO TABS
1000.0000 mg | ORAL_TABLET | Freq: Four times a day (QID) | ORAL | Status: DC
  Administered 2017-12-14 – 2017-12-15 (×5): 1000 mg via ORAL
  Filled 2017-12-14 (×5): qty 2

## 2017-12-14 MED ORDER — ONDANSETRON HCL 4 MG/2ML IJ SOLN: 4.0000 mg | Freq: Four times a day (QID) | INTRAMUSCULAR | Status: DC | PRN

## 2017-12-14 MED ORDER — OXYCODONE HCL 5 MG PO TABS
5.0000 mg | ORAL_TABLET | ORAL | Status: DC | PRN
  Administered 2017-12-14 – 2017-12-15 (×4): 10 mg via ORAL
  Filled 2017-12-14 (×5): qty 2

## 2017-12-14 MED ORDER — MORPHINE SULFATE (PF) 2 MG/ML IV SOLN
1.0000 mg | INTRAVENOUS | Status: DC | PRN
  Administered 2017-12-14 – 2017-12-15 (×3): 2 mg via INTRAVENOUS
  Filled 2017-12-14 (×3): qty 1

## 2017-12-14 MED ORDER — INSULIN ASPART 100 UNIT/ML ~~LOC~~ SOLN: 0.0000 [IU] | Freq: Every day | SUBCUTANEOUS | Status: DC

## 2017-12-14 MED ORDER — METOPROLOL TARTRATE 5 MG/5ML IV SOLN: 5.0000 mg | Freq: Four times a day (QID) | INTRAVENOUS | Status: DC | PRN

## 2017-12-14 MED ORDER — ENOXAPARIN SODIUM 40 MG/0.4ML ~~LOC~~ SOLN
40.0000 mg | SUBCUTANEOUS | Status: DC
  Administered 2017-12-14 – 2017-12-15 (×2): 40 mg via SUBCUTANEOUS
  Filled 2017-12-14 (×2): qty 0.4

## 2017-12-14 MED ORDER — AMLODIPINE BESYLATE 5 MG PO TABS
5.0000 mg | ORAL_TABLET | Freq: Every day | ORAL | Status: DC
  Administered 2017-12-14 – 2017-12-15 (×2): 5 mg via ORAL
  Filled 2017-12-14 (×2): qty 1

## 2017-12-14 MED ORDER — INSULIN ASPART 100 UNIT/ML ~~LOC~~ SOLN
0.0000 [IU] | Freq: Three times a day (TID) | SUBCUTANEOUS | Status: DC
  Administered 2017-12-14 (×2): 5 [IU] via SUBCUTANEOUS
  Administered 2017-12-15: 11 [IU] via SUBCUTANEOUS

## 2017-12-14 MED ORDER — OXYCODONE HCL 5 MG PO TABS: 5.0000 mg | ORAL_TABLET | ORAL | Status: DC | PRN

## 2017-12-14 MED ORDER — POLYETHYLENE GLYCOL 3350 17 G PO PACK: 17.0000 g | PACK | Freq: Every day | ORAL | Status: DC | PRN

## 2017-12-14 MED ORDER — METHOCARBAMOL 500 MG PO TABS
500.0000 mg | ORAL_TABLET | Freq: Three times a day (TID) | ORAL | Status: DC
  Administered 2017-12-14 (×3): 500 mg via ORAL
  Filled 2017-12-14 (×3): qty 1

## 2017-12-14 MED ORDER — KETOROLAC TROMETHAMINE 30 MG/ML IJ SOLN
30.0000 mg | Freq: Once | INTRAMUSCULAR | Status: AC
  Administered 2017-12-14: 30 mg via INTRAVENOUS
  Filled 2017-12-14: qty 1

## 2017-12-14 MED ORDER — ASPIRIN EC 81 MG PO TBEC
81.0000 mg | DELAYED_RELEASE_TABLET | Freq: Every day | ORAL | Status: DC
  Administered 2017-12-14 – 2017-12-15 (×2): 81 mg via ORAL
  Filled 2017-12-14 (×2): qty 1

## 2017-12-14 MED ORDER — CLONAZEPAM 1 MG PO TABS
1.0000 mg | ORAL_TABLET | Freq: Every day | ORAL | Status: DC
  Administered 2017-12-14: 1 mg via ORAL
  Filled 2017-12-14: qty 1

## 2017-12-14 MED ORDER — GLIPIZIDE 5 MG PO TABS
10.0000 mg | ORAL_TABLET | Freq: Two times a day (BID) | ORAL | Status: DC
  Administered 2017-12-14 – 2017-12-15 (×2): 10 mg via ORAL
  Filled 2017-12-14 (×2): qty 2

## 2017-12-14 NOTE — Progress Notes (Signed)
SATURATION QUALIFICATIONS: (This note is used to comply with regulatory documentation for home oxygen)  Patient Saturations on Room Air at Rest = 91%  Patient Saturations on Room Air while Ambulating = 87%  Patient Saturations on 2 Liters of oxygen while Ambulating = 90%  Please briefly explain why patient needs home oxygen:Pt requiring O2 with activity to keep sats >90%. Thanks. Srishti Strnad,PT Acute Rehabilitation Services Pager:  [phone]  Office:  [phone]

## 2017-12-14 NOTE — Evaluation (Signed)
Physical Therapy Evaluation Patient Details Name: Jacob Macdonald MRN: [id number] DOB: 02/07/1946 Today's Date: 12/14/2017   History of Present Illness  Jacob Macdonald is a 72 yo male with a history of type 2 diabetes mellitus, GERD, and HTN presenting to the [facility] trauma [facility] for a fall. Patient states that he was attempting to put a cover on his motor home 2 days ago on 12/12/17 around 3 pm and lost his step while climbing down a ladder which resulted in a fall from 5 feet. Patient states that he landed on grass on his right side including his shoulder. Patient states that the fall "knocked the wind out of him" afterwards and does report hitting his head, however denies LOC. Patient noted left sided chest pain immediately afterwards. Patient has been ambulatory since the fall. Patient did not seek medical attention on the day of the accident and was brought the following day (10/16) to the [facility] ED by his daughter. Sustained rib fractures left side 3-8.    Clinical Impression  Pt admitted with above diagnosis. Pt currently with functional limitations due to the deficits listed below (see PT Problem List). Pt was able to ambulate on the unit with supervision. Limited by pain but is steady overall and should not need device at home.  Desat on RA thus encouraged incentive spirometer.  Will follow acutely.  Pt will benefit from skilled PT to increase their independence and safety with mobility to allow discharge to the venue listed below.    SATURATION QUALIFICATIONS: (This note is used to comply with regulatory documentation for home oxygen)  Patient Saturations on Room Air at Rest = 91%  Patient Saturations on Room Air while Ambulating = 87%  Patient Saturations on 2 Liters of oxygen while Ambulating = 90%  Please briefly explain why patient needs home oxygen:Pt requiring O2 with activity to keep sats >90%.  Follow Up Recommendations No PT follow up;Supervision - Intermittent     Equipment Recommendations  None recommended by PT    Recommendations for Other Services       Precautions / Restrictions Precautions Precautions: Fall Restrictions Weight Bearing Restrictions: No      Mobility  Bed Mobility Overal bed mobility: Needs Assistance Bed Mobility: Rolling;Sidelying to Sit Rolling: Supervision Sidelying to sit: Supervision       General bed mobility comments: cues for technique log roll for comfort.   Transfers Overall transfer level: Needs assistance Equipment used: None Transfers: Sit to/from Stand Sit to Stand: Supervision         General transfer comment: able to stand without help.    Ambulation/Gait Ambulation/Gait assistance: Min guard Gait Distance (Feet): 280 Feet Assistive device: None Gait Pattern/deviations: Step-through pattern;Decreased stride length   Gait velocity interpretation: <1.31 ft/sec, indicative of household ambulator General Gait Details: No LOb with ambulation without device.  no challenges to balance however pt steady on his feet.  O2 sats 93% on 2L initiallly.  Sats to 87% on RA with activity.  Replaced O2 at 2L on return to room.  Encouraged incentive spirometer and pt performed x 10.    Stairs            Wheelchair Mobility    Modified Rankin (Stroke Patients Only)       Balance Overall balance assessment: Needs assistance Sitting-balance support: No upper extremity supported;Feet supported Sitting balance-Leahy Scale: Fair     Standing balance support: During functional activity;No upper extremity supported Standing balance-Leahy Scale: Fair Standing balance comment: No  UE support needed.                              Pertinent Vitals/Pain Pain Assessment: 0-10 Pain Score: 10-Worst pain ever Pain Location: left side chest area  Pain Descriptors / Indicators: Aching;Grimacing;Guarding Pain Intervention(s): Limited activity within patient's tolerance;Monitored during  session;Repositioned;Premedicated before session    Home Living Family/patient expects to be discharged to:: Private residence Living Arrangements: Spouse/significant other Available Help at Discharge: Family;Available 24 hours/day(daughter there at night, wife recent spinal fusion) Type of Home: House Home Access: Stairs to enter Entrance Stairs-Rails: Right;Left;Can reach both Entrance Stairs-Number of Steps: 4 Home Layout: One level Home Equipment: Shower seat - built in;Hand held shower head      Prior Function Level of Independence: Independent         Comments: Pt drives, did yardwork     Hand Dominance   Dominant Hand: Right    Extremity/Trunk Assessment   Upper Extremity Assessment Upper Extremity Assessment: Defer to OT evaluation    Lower Extremity Assessment Lower Extremity Assessment: Generalized weakness    Cervical / Trunk Assessment Cervical / Trunk Assessment: Normal  Communication   Communication: No difficulties  Cognition Arousal/Alertness: Awake/alert Behavior During Therapy: WFL for tasks assessed/performed Overall Cognitive Status: Within Functional Limits for tasks assessed                                        General Comments General comments (skin integrity, edema, etc.): 94% on 2LO2, 91% on RA    Exercises     Assessment/Plan    PT Assessment Patient needs continued PT services  PT Problem List Decreased activity tolerance;Decreased balance;Decreased mobility;Decreased knowledge of use of DME;Decreased safety awareness;Decreased knowledge of precautions;Cardiopulmonary status limiting activity;Pain       PT Treatment Interventions DME instruction;Gait training;Stair training;Functional mobility training;Therapeutic activities;Therapeutic exercise;Balance training;Patient/family education    PT Goals (Current goals can be found in the Care Plan section)  Acute Rehab PT Goals Patient Stated Goal: to go home PT  Goal Formulation: With patient Time For Goal Achievement: 12/28/17 Potential to Achieve Goals: Good    Frequency Min 3X/week   Barriers to discharge        Co-evaluation               AM-PAC PT "6 Clicks" Daily Activity  Outcome Measure Difficulty turning over in bed (including adjusting bedclothes, sheets and blankets)?: None Difficulty moving from lying on back to sitting on the side of the bed? : None Difficulty sitting down on and standing up from a chair with arms (e.g., wheelchair, bedside commode, etc,.)?: None Help needed moving to and from a bed to chair (including a wheelchair)?: None Help needed walking in hospital room?: A Little Help needed climbing 3-5 steps with a railing? : A Little 6 Click Score: 22    End of Session Equipment Utilized During Treatment: Gait belt;Oxygen Activity Tolerance: Patient limited by fatigue;Patient limited by pain Patient left: in chair;with call bell/phone within reach;with chair alarm set;with family/visitor present Nurse Communication: Mobility status PT Visit Diagnosis: Unsteadiness on feet (R26.81);Muscle weakness (generalized) (M62.81);Pain Pain - part of body: (ribs)    Time: 1010-1034 PT Time Calculation (min) (ACUTE ONLY): 24 min   Charges:   PT Evaluation $PT Eval Moderate Complexity: 1 Mod PT Treatments $Gait Training: 8-22 mins  Allan Bacigalupi,PT Acute Rehabilitation Services Pager:  [phone]  Office:  [phone]    Berline Lopes 12/14/2017, 2:02 PM

## 2017-12-14 NOTE — Progress Notes (Signed)
Pt c/o slight difficulty breathing ,oxygen sats 89% on room air. 2liters oxygen initiated and saturations went up to 94%. Blood pressure also running slightly high , pt medicated for 10/10 generalized pain. Monitoring to continue

## 2017-12-14 NOTE — H&P (Addendum)
[facility] Admission Note  PAUBLO WARSHAWSKY Aug 18, 1945  [id number].    Requesting MD:  Chief Complaint/Reason for Consult: fall from ladder  HPI:  Mr. Jacob Macdonald is a 72 yo male with a history of type 2 diabetes mellitus, GERD, and HTN presenting to the [facility] for a fall. Patient states that he was attempting to put a cover on his motor home 2 days ago on 12/12/17 around 3 pm and lost his step while climbing down a ladder which resulted in a fall from 5 feet. Patient states that he landed on grass on his right side including his shoulder. Patient states that the fall "knocked the wind out of him" afterwards and does report hitting his head, however denies LOC. Patient noted left sided chest pain immediately afterwards. Patient has been ambulatory since the fall. Patient did not seek medical attention on the day of the accident and was brought the following day (10/16) to the [facility] ED by his daughter. Patient takes aspirin 81 mg daily.  Patient continues to complain of severe left sided chest pain that is worse with coughing, breathing deeply, burping and sitting up. He reports that the pain is not relieved by the pain medication he is on now. Patient states that the pain improves when he raises his right arm above his head. Patient has not had anything to eat today but is hungry and denies nausea, vomiting, and abdominal pain. Patient drank water last night and has been urinating by walking to the bathroom. Patient has not has a bowel movement but has been able to pass gas.   PMH significant for type 2 diabetes mellitus, GERD, hypertension Abdominal surgical history: Hiatal hernia repair Anticoagulants: aspirin 81 mg Tbcc: current everyday cigarette smoker (1 pack/day) Alc: none   ROS: ROS  Constitutional: negative for fever and chills Respiratory: Positive for SOB. Negative for cough. Cardiovascular: Positive for chest pain GI: negative for abdominal pain,  nausea, and vomiting Musculoskeletal: Positive for myalgias, back pain, and neck pain. Negative for gait disturbance Skin: negative for bruising and rash Neuro: negative for headaches lightheadedness, dizziness, and syncope.  All systems reviewed and otherwise negative except for as above  No family history on file.  Past Medical History:  Diagnosis Date  . Diabetes mellitus without complication ([place])   . Distal radius fracture, left   . GERD (gastroesophageal reflux disease)   . History of hiatal hernia   . Hypertension     Past Surgical History:  Procedure Laterality Date  . HERNIA REPAIR    . OPEN REDUCTION INTERNAL FIXATION (ORIF) DISTAL RADIAL FRACTURE Left 08/03/2015   Procedure: OPEN TREATMENT OF LEFT DISTAL RADIUS FRACTURE;  Surgeon: Milly Jakob, MD;  Location: [place] [facility];  Service: Orthopedics;  Laterality: Left;  GENERAL ANESTHESIA WITH PRE-OP BLOCK  . SHOULDER ARTHROSCOPY Right     Social History:  reports that he has been smoking cigarettes. He has been smoking about 1.00 pack per day. He does not have any smokeless tobacco history on file. He reports that he does not drink alcohol or use drugs.  Allergies:  Allergies  Allergen Reactions  . Lisinopril Swelling    REACTION: swelling  . Penicillins Other (See Comments)    REACTION: swelling Has patient had a PCN reaction causing immediate rash, facial/tongue/throat swelling, SOB or lightheadedness with hypotension: unknown Has patient had a PCN reaction causing severe rash involving mucus membranes or skin necrosis: unknown Has patient had a PCN reaction that required  hospitalization unknown Has patient had a PCN reaction occurring within the last 10 years: unknown If all of the above answers are "NO", then may proceed with Cephalosporin use.   . Shellfish Allergy Nausea And Vomiting         Medications Prior to Admission  Medication Sig Dispense Refill  . amLODipine (NORVASC) 5 MG tablet Take  5 mg by mouth daily.    Marland Kitchen aspirin EC 81 MG tablet Take 81 mg by mouth daily.    . clonazePAM (KLONOPIN) 1 MG tablet Take 1 mg by mouth at bedtime.     . Cyanocobalamin (B-12 PO) Take 1 tablet by mouth at bedtime.    Marland Kitchen glipiZIDE (GLUCOTROL) 10 MG tablet Take 10 mg by mouth 2 (two) times daily before a meal.    . HYDROcodone-acetaminophen (NORCO/VICODIN) 5-325 MG tablet Take 1 tablet by mouth 2 (two) times daily. Reported on 07/24/2015    . metFORMIN (GLUCOPHAGE-XR) 500 MG 24 hr tablet Take 1,000 mg by mouth 2 (two) times daily.    Marland Kitchen omeprazole (PRILOSEC) 20 MG capsule Take 20 mg by mouth daily.    . Pyridoxine HCl (B-6 PO) Take 1 tablet by mouth at bedtime.    . rosuvastatin (CRESTOR) 10 MG tablet Take 5 mg by mouth daily.    . simvastatin (ZOCOR) 80 MG tablet Take 40 mg by mouth at bedtime.      Prior to Admission medications   Medication Sig Start Date End Date Taking? Authorizing Provider  amLODipine (NORVASC) 5 MG tablet Take 5 mg by mouth daily.   Yes [provider]  aspirin EC 81 MG tablet Take 81 mg by mouth daily.   Yes [provider]  clonazePAM (KLONOPIN) 1 MG tablet Take 1 mg by mouth at bedtime.  07/23/15  Yes [provider]  Cyanocobalamin (B-12 PO) Take 1 tablet by mouth at bedtime.   Yes [provider]  glipiZIDE (GLUCOTROL) 10 MG tablet Take 10 mg by mouth 2 (two) times daily before a meal.   Yes [provider]  HYDROcodone-acetaminophen (NORCO/VICODIN) 5-325 MG tablet Take 1 tablet by mouth 2 (two) times daily. Reported on 07/24/2015 07/16/15  Yes [provider]  metFORMIN (GLUCOPHAGE-XR) 500 MG 24 hr tablet Take 1,000 mg by mouth 2 (two) times daily.   Yes [provider]  omeprazole (PRILOSEC) 20 MG capsule Take 20 mg by mouth daily. 06/09/15  Yes [provider]  Pyridoxine HCl (B-6 PO) Take 1 tablet by mouth at bedtime.   Yes [provider]  rosuvastatin (CRESTOR) 10 MG tablet Take 5 mg by  mouth daily.   Yes [provider]  simvastatin (ZOCOR) 80 MG tablet Take 40 mg by mouth at bedtime. 06/06/15  Yes [provider]    Blood pressure (!) 174/72, pulse 73, temperature 98 F (36.7 C), temperature source Oral, resp. rate 20, SpO2 94 %. Physical Exam: General: pleasant, well developed male in no acute distress exhibiting some agitation secondary to pain. Head: normocephalic with no lumps or bruising. No tenderness with scalp palpation. Eyes: PERRL. EOM full Neck: ROM full. Cardiovascular: regular rate and rhythm with no murmurs, gallops, or rubs. Distal pulses 2+ Pulmonary: normal respiratory effort without wheezes or rales.  Abdominal: no distension, soft, nontender. Bowel sounds present in all quadrants. Musculoskeletal: tenderness with minimal palpation of chest.  Neuro: A&O x3. CN grossly intact. Grip strength full and sensation intact. Movement of extremities is symmetric. Skin: warm and dry without rash, erythema,  or bruising.  Results for orders placed or performed during the hospital encounter of 12/13/17 (from the past 48 hour(s))  CBC     Status: Abnormal   Collection Time: 12/13/17  6:13 PM  Result Value Ref Range   WBC 10.2 4.0 - 10.5 K/uL   RBC 5.19 4.22 - 5.81 MIL/uL   Hemoglobin 13.3 13.0 - 17.0 g/dL   HCT 42.4 39.0 - 52.0 %   MCV 81.7 80.0 - 100.0 fL   MCH 25.6 (L) 26.0 - 34.0 pg   MCHC 31.4 30.0 - 36.0 g/dL   RDW 15.5 11.5 - 15.5 %   Platelets 268 150 - 400 K/uL   nRBC 0.0 0.0 - 0.2 %    Comment: Performed at [facility], [place] [address], [place], [place] [address]  Sample to Blood Bank     Status: None   Collection Time: 12/13/17  6:13 PM  Result Value Ref Range   Blood Bank Specimen SAMPLE AVAILABLE FOR TESTING    Sample Expiration      12/16/2017 Performed at [facility], [place] [address] [place], [place] [address]   Basic metabolic panel     Status: Abnormal   Collection Time: 12/13/17   6:13 PM  Result Value Ref Range   Sodium 137 135 - 145 mmol/L   Potassium 4.1 3.5 - 5.1 mmol/L   Chloride 101 98 - 111 mmol/L   CO2 24 22 - 32 mmol/L   Glucose, Bld 244 (H) 70 - 99 mg/dL   BUN 11 8 - 23 mg/dL   Creatinine, Ser 0.85 0.61 - 1.24 mg/dL   Calcium 9.0 8.9 - 10.3 mg/dL   GFR calc non Af Amer >60 >60 mL/min   GFR calc Af Amer >60 >60 mL/min    Comment: (NOTE) The eGFR has been calculated using the CKD EPI equation. This calculation has not been validated in all clinical situations. eGFR's persistently <60 mL/min signify possible Chronic Kidney Disease.    Anion gap 12 5 - 15    Comment: Performed at [facility], [place] [address], [place], [place] [address]  I-Stat Troponin, ED     Status: None   Collection Time: 12/13/17  6:27 PM  Result Value Ref Range   Troponin i, poc 0.00 0.00 - 0.08 ng/mL   Comment 3            Comment: Due to the release kinetics of cTnI, a negative result within the first hours of the onset of symptoms does not rule out myocardial infarction with certainty. If myocardial infarction is still suspected, repeat the test at appropriate intervals.   Urinalysis, Routine w reflex microscopic     Status: Abnormal   Collection Time: 12/13/17  6:49 PM  Result Value Ref Range   Color, Urine YELLOW YELLOW   APPearance CLEAR CLEAR   Specific Gravity, Urine 1.018 1.005 - 1.030   pH 6.0 5.0 - 8.0   Glucose, UA >=500 (A) NEGATIVE mg/dL   Hgb urine dipstick SMALL (A) NEGATIVE   Bilirubin Urine NEGATIVE NEGATIVE   Ketones, ur 5 (A) NEGATIVE mg/dL   Protein, ur NEGATIVE NEGATIVE mg/dL   Nitrite NEGATIVE NEGATIVE   Leukocytes, UA NEGATIVE NEGATIVE   RBC / HPF 6-10 0 - 5 RBC/hpf   WBC, UA 0-5 0 - 5 WBC/hpf   Bacteria, UA NONE SEEN NONE SEEN   Mucus PRESENT     Comment: Performed at [facility], [place]  [address], [place], [place] [address]   Dg Chest 2 View  Result Date: 12/13/2017 CLINICAL DATA:  Fall yesterday  EXAM: CHEST - 2 VIEW COMPARISON:  07/24/2015 FINDINGS: Lungs are under aerated with bibasilar atelectasis. The heart is normal in size. No pneumothorax or pleural effusion. No definite acute rib fracture. IMPRESSION: Bibasilar atelectasis. Electronically Signed   By: Marybelle Killings M.D.   On: 12/13/2017 19:21   Dg Pelvis 1-2 Views  Result Date: 12/13/2017 CLINICAL DATA:  Patient fell 5 feet off ladder landing on right hip. Pain. EXAM: PELVIS - 1-2 VIEW COMPARISON:  None. FINDINGS: There is no evidence of pelvic fracture or diastasis. No pelvic bone lesions are seen. Lower lumbar facet arthropathy seen at L5-S1 on the right. Both hip joints are maintained with slight joint space narrowing bilaterally. No acute fracture of the bony pelvis and either hip. IMPRESSION: No acute osseous abnormality of the pelvis and included hips. Lower lumbar facet arthropathy L5-S1 on the right. Mild degenerative joint space narrowing of both hips. Electronically Signed   By: Ashley Royalty M.D.   On: 12/13/2017 19:22   Ct Head Wo Contrast  Result Date: 12/13/2017 CLINICAL DATA:  Fall from ladder with back pain.  Initial encounter. EXAM: CT HEAD WITHOUT CONTRAST CT CERVICAL SPINE WITHOUT CONTRAST TECHNIQUE: Multidetector CT imaging of the head and cervical spine was performed following the standard protocol without intravenous contrast. Multiplanar CT image reconstructions of the cervical spine were also generated. COMPARISON:  07/24/2015 FINDINGS: CT HEAD FINDINGS Brain: No evidence of acute infarction, hemorrhage, hydrocephalus, extra-axial collection or mass lesion/mass effect. Remote lacunar infarct in the left thalamus, also seen in 2017. Mild cerebral volume loss that is generalized Vascular: No hyperdense vessel or unexpected calcification. Skull: Negative for fracture.  Patchy scalp scarring. Sinuses/Orbits: No evidence of injury CT CERVICAL SPINE FINDINGS Alignment: Normal Skull base and vertebrae: Negative for fracture  Soft tissues and spinal canal: No prevertebral fluid or swelling. No visible canal hematoma. Disc levels: Spondylosis. No evidence of degenerative cord impingement Upper chest: Reported separately IMPRESSION: No evidence of intracranial or cervical spine injury. Electronically Signed   By: Monte Fantasia M.D.   On: 12/13/2017 20:17   Ct Chest W Contrast  Result Date: 12/13/2017 CLINICAL DATA:  Fall from ladder yesterday. EXAM: CT CHEST, ABDOMEN, AND PELVIS WITH CONTRAST TECHNIQUE: Multidetector CT imaging of the chest, abdomen and pelvis was performed following the standard protocol during bolus administration of intravenous contrast. CONTRAST:  146m ISOVUE-300 IOPAMIDOL (ISOVUE-300) INJECTION 61% COMPARISON:  None. FINDINGS: CT CHEST FINDINGS Cardiovascular: Atherosclerotic calcifications of the aorta are noted. No evidence of aortic rupture or aneurysm. No evidence of dissection or intramural hematoma. Mild atherosclerotic changes of the proximal great vessels are present. Mild 3 vessel coronary artery calcifications. Mediastinum/Nodes: No abnormal mediastinal adenopathy. No pericardial effusion. No evidence of mediastinal hemorrhage. Lungs/Pleura: No pneumothorax. Small left pleural effusion. Dependent atelectasis bilaterally. Musculoskeletal: No vertebral compression deformity. Acute fractures of the lateral left third, 4, fifth, sixth, seventh, and eighth ribs are noted. CT ABDOMEN PELVIS FINDINGS Hepatobiliary: Unremarkable Pancreas: Unremarkable Spleen: Unremarkable Adrenals/Urinary Tract: Adrenal glands are within normal limits. Kidneys are unremarkable. Bladder is distended. Stomach/Bowel: No obvious mass in the colon. No evidence of small-bowel obstruction. Unremarkable stomach. Vascular/Lymphatic: Aortic atherosclerotic calcifications and predominately soft plaque. Atherosclerotic changes of the iliac arteries are also noted. No abnormal retroperitoneal adenopathy. Reproductive: Prostate is  enlarged. Other: No free fluid.  No evidence of hemoperitoneum. Musculoskeletal: No vertebral compression deformity. IMPRESSION: Multiple minimally  displaced fractures of the left third, fourth, fifth, 6, seventh, and eighth ribs. No pneumothorax. No evidence of organ injury in the abdomen or pelvis. Bladder distention. Electronically Signed   By: Marybelle Killings M.D.   On: 12/13/2017 20:23   Ct Cervical Spine Wo Contrast  Result Date: 12/13/2017 CLINICAL DATA:  Fall from ladder with back pain.  Initial encounter. EXAM: CT HEAD WITHOUT CONTRAST CT CERVICAL SPINE WITHOUT CONTRAST TECHNIQUE: Multidetector CT imaging of the head and cervical spine was performed following the standard protocol without intravenous contrast. Multiplanar CT image reconstructions of the cervical spine were also generated. COMPARISON:  07/24/2015 FINDINGS: CT HEAD FINDINGS Brain: No evidence of acute infarction, hemorrhage, hydrocephalus, extra-axial collection or mass lesion/mass effect. Remote lacunar infarct in the left thalamus, also seen in 2017. Mild cerebral volume loss that is generalized Vascular: No hyperdense vessel or unexpected calcification. Skull: Negative for fracture.  Patchy scalp scarring. Sinuses/Orbits: No evidence of injury CT CERVICAL SPINE FINDINGS Alignment: Normal Skull base and vertebrae: Negative for fracture Soft tissues and spinal canal: No prevertebral fluid or swelling. No visible canal hematoma. Disc levels: Spondylosis. No evidence of degenerative cord impingement Upper chest: Reported separately IMPRESSION: No evidence of intracranial or cervical spine injury. Electronically Signed   By: Monte Fantasia M.D.   On: 12/13/2017 20:17   Ct Abdomen Pelvis W Contrast  Result Date: 12/13/2017 CLINICAL DATA:  Fall from ladder yesterday. EXAM: CT CHEST, ABDOMEN, AND PELVIS WITH CONTRAST TECHNIQUE: Multidetector CT imaging of the chest, abdomen and pelvis was performed following the standard protocol during  bolus administration of intravenous contrast. CONTRAST:  114m ISOVUE-300 IOPAMIDOL (ISOVUE-300) INJECTION 61% COMPARISON:  None. FINDINGS: CT CHEST FINDINGS Cardiovascular: Atherosclerotic calcifications of the aorta are noted. No evidence of aortic rupture or aneurysm. No evidence of dissection or intramural hematoma. Mild atherosclerotic changes of the proximal great vessels are present. Mild 3 vessel coronary artery calcifications. Mediastinum/Nodes: No abnormal mediastinal adenopathy. No pericardial effusion. No evidence of mediastinal hemorrhage. Lungs/Pleura: No pneumothorax. Small left pleural effusion. Dependent atelectasis bilaterally. Musculoskeletal: No vertebral compression deformity. Acute fractures of the lateral left third, 4, fifth, sixth, seventh, and eighth ribs are noted. CT ABDOMEN PELVIS FINDINGS Hepatobiliary: Unremarkable Pancreas: Unremarkable Spleen: Unremarkable Adrenals/Urinary Tract: Adrenal glands are within normal limits. Kidneys are unremarkable. Bladder is distended. Stomach/Bowel: No obvious mass in the colon. No evidence of small-bowel obstruction. Unremarkable stomach. Vascular/Lymphatic: Aortic atherosclerotic calcifications and predominately soft plaque. Atherosclerotic changes of the iliac arteries are also noted. No abnormal retroperitoneal adenopathy. Reproductive: Prostate is enlarged. Other: No free fluid.  No evidence of hemoperitoneum. Musculoskeletal: No vertebral compression deformity. IMPRESSION: Multiple minimally displaced fractures of the left third, fourth, fifth, 6, seventh, and eighth ribs. No pneumothorax. No evidence of organ injury in the abdomen or pelvis. Bladder distention. Electronically Signed   By: AMarybelle KillingsM.D.   On: 12/13/2017 20:23      Assessment/Plan Fall from 5 foot height - rib 3-8 fractures  ID - no antibiotics VTE - SCD, start Lovenox  FEN - Carb modified diet, thin fluids Foley - none  Plan -  Ribs 3-8 fractures: repeat chest  x-ray to r/o PTX, pulmonary toileting, and IS Multimodal pain control: adjusted pain medications - added tylenol, morphine, and robaxin. Discontinued dilaudid  Type 2 diabetes mellitus: sliding scale insulin Hypertension: home meds. Metoprolol PRN for SBP > 180 or DBP > 90. Do not give in HR < 60 Consults: OT and PT evals   [employer] PA-C [facility] 12/14/2017, 8:55 AM Pager: [phone]   Patient has pain as expected, but able to get IS up to 1000 currently.  CXR showed a very small apical PTX on the left which is not clinically significant.  Will watch overnight and repeat CXR in the AM  This patient has been seen and I agree with the findings and treatment plan.  Kathryne Eriksson. Dahlia Bailiff, MD, [place] [phone] (pager) [phone] (direct pager) Trauma Surgeon

## 2017-12-14 NOTE — Progress Notes (Signed)
Pt roomed to unit at 2330hrs. MD paged for admission orders and orders provided as charted

## 2017-12-15 ENCOUNTER — Inpatient Hospital Stay (HOSPITAL_COMMUNITY): Payer: PPO

## 2017-12-15 LAB — BASIC METABOLIC PANEL
Anion gap: 11 (ref 5–15)
BUN: 8 mg/dL (ref 8–23)
CALCIUM: 8.9 mg/dL (ref 8.9–10.3)
CO2: 24 mmol/L (ref 22–32)
CREATININE: 0.83 mg/dL (ref 0.61–1.24)
Chloride: 103 mmol/L (ref 98–111)
GFR calc Af Amer: >60 mL/min (ref >60)
Glucose, Bld: 101 mg/dL — ABNORMAL HIGH (ref 70–99)
POTASSIUM: 3.5 mmol/L (ref 3.5–5.1)
SODIUM: 138 mmol/L (ref 135–145)

## 2017-12-15 LAB — CBC
HCT: 38.6 % — ABNORMAL LOW (ref 39.0–52.0)
HEMOGLOBIN: 11.7 g/dL — AB (ref 13.0–17.0)
MCH: 24.6 pg — AB (ref 26.0–34.0)
MCHC: 30.3 g/dL (ref 30.0–36.0)
MCV: 81.1 fL (ref 80.0–100.0)
Platelets: 227 10*3/uL (ref 150–400)
RBC: 4.76 MIL/uL (ref 4.22–5.81)
RDW: 15.3 % (ref 11.5–15.5)
WBC: 7.6 10*3/uL (ref 4.0–10.5)
nRBC: 0 % (ref 0.0–0.2)

## 2017-12-15 LAB — GLUCOSE, CAPILLARY
GLUCOSE-CAPILLARY: 118 mg/dL — AB (ref 70–99)
Glucose-Capillary: 343 mg/dL — ABNORMAL HIGH (ref 70–99)

## 2017-12-15 MED ORDER — GABAPENTIN 300 MG PO CAPS
300.0000 mg | ORAL_CAPSULE | Freq: Three times a day (TID) | ORAL | Status: DC
  Administered 2017-12-15: 300 mg via ORAL
  Filled 2017-12-15: qty 1

## 2017-12-15 MED ORDER — ACETAMINOPHEN 500 MG PO TABS: 1000.0000 mg | ORAL_TABLET | Freq: Four times a day (QID) | ORAL | 0 refills | Status: AC | PRN

## 2017-12-15 MED ORDER — METHOCARBAMOL 500 MG PO TABS: 1000.0000 mg | ORAL_TABLET | Freq: Three times a day (TID) | ORAL | 0 refills | Status: DC | PRN

## 2017-12-15 MED ORDER — OXYCODONE HCL 5 MG PO TABS: 5.0000 mg | ORAL_TABLET | ORAL | 0 refills | Status: DC | PRN

## 2017-12-15 MED ORDER — METHOCARBAMOL 500 MG PO TABS
1000.0000 mg | ORAL_TABLET | Freq: Three times a day (TID) | ORAL | Status: DC
  Administered 2017-12-15: 1000 mg via ORAL
  Filled 2017-12-15: qty 2

## 2017-12-15 MED ORDER — GABAPENTIN 100 MG PO CAPS: 300.0000 mg | ORAL_CAPSULE | Freq: Three times a day (TID) | ORAL | 0 refills | Status: DC

## 2017-12-15 MED ORDER — KETOROLAC TROMETHAMINE 30 MG/ML IJ SOLN
15.0000 mg | Freq: Three times a day (TID) | INTRAMUSCULAR | Status: DC
  Administered 2017-12-15 (×2): 15 mg via INTRAVENOUS
  Filled 2017-12-15 (×2): qty 1

## 2017-12-15 NOTE — Progress Notes (Signed)
Nutrition Brief Note  Patient identified on the Malnutrition Screening Tool (MST) Report  Wt Readings from Last 15 Encounters:  08/03/15 73.4 kg   Mr. Deese is a 72 yo male with a history of type 2 diabetes mellitus, GERD, and HTN presenting to the [facility] trauma center for a fall. Patient states that he was attempting to put a cover on his motor home 2 days ago on 12/12/17 around 3 pm and lost his step while climbing down a ladder which resulted in a fall from 5 feet. Patient states that he landed on grass on his right side including his shoulder. Patient states that the fall "knocked the wind out of him" afterwards and does report hitting his head, however denies LOC  Pt admitted with rib 3-8 fractures s/p fall.   Case discussed with RN, who reports potential discharge pending therapy evaluation.   Spoke with pt and wife, who report pt has a great appetite. He consumed 100% of breakfast tray, along with a [place] muffin. Pt reports he generally consumes 2 meals and 2-3 snacks per day (Breakfdast: eggs with cheese, sausage, and biscuit, Dinner: [employer], Dinner: nabs and oatmeal cookie).   Pt reports Hgb A1c usually runs around 8. He is managed by the [facility]. PTA DM medications are 10 mg glipizide daily and 1000 mg metformin BID.   Labs reviewed: CBGS: 118-169 (inpatient orders for glycemic control are 0-5 units insulin aspart q HS, 0-15 units insulin aspart TID with meeals, amd 10 mg glipizide BID ).   There is no height or weight on file to calculate BMI. Pt was sitting in recliner chair, so unable to obtain bed weight.  Pt reports progressive wt loss over the past several years, but denies changes in eating habits or activity level. Suspect uncontrolled DM may be a component to weight loss.   Nutrition-Focused physical exam completed. Findings are no fat depletion, no muscle depletion, and no edema.   Current diet order is Carb Modified, patient is consuming approximately  100% of meals at this time. Labs and medications reviewed.   No nutrition interventions warranted at this time. If nutrition issues arise, please consult RD.   Skylinn Vialpando A. Mayford Knife, RD, LDN, CDE Pager: [phone] After hours Pager: [phone]

## 2017-12-15 NOTE — Progress Notes (Signed)
Pt for discharge going home his wife at the bedside, discontinued the peripheral IV line, given all his personal belongings, given health teachings, prescriptions, next appointment, due med explained and understood, given pain meds prior to discharge, therapy done too.

## 2017-12-15 NOTE — Evaluation (Signed)
Occupational Therapy Evaluation Patient Details Name: Jacob Macdonald MRN: [id number] DOB: Jul 24, 1945 Today's Date: 12/15/2017    History of Present Illness Jacob Macdonald is a 72 yo male with a history of type 2 diabetes mellitus, GERD, and HTN presenting to the [facility] for a fall. Patient states that he was attempting to put a cover on his motor home 2 days ago on 12/12/17 around 3 pm and lost his step while climbing down a ladder which resulted in a fall from 5 feet. Patient states that he landed on grass on his right side including his shoulder. Patient states that the fall "knocked the wind out of him" afterwards and does report hitting his head, however denies LOC. Patient noted left sided chest pain immediately afterwards. Patient has been ambulatory since the fall. Patient did not seek medical attention on the day of the accident and was brought the following day (10/16) to the [facility] by his daughter. Sustained rib fractures left side 3-8.     Clinical Impression   Patient evaluated by Occupational Therapy with no further acute OT needs identified. All education has been completed and the patient has no further questions. See below for any follow-up Occupational Therapy or equipment needs. OT to sign off. Thank you for referral.      Follow Up Recommendations  No OT follow up    Equipment Recommendations  None recommended by OT    Recommendations for Other Services       Precautions / Restrictions Precautions Precautions: Fall Restrictions Weight Bearing Restrictions: No      Mobility Bed Mobility               General bed mobility comments: in the wheelchair returning from xray on arrival  Transfers Overall transfer level: Modified independent Equipment used: None Transfers: Sit to/from Stand Sit to Stand: Supervision         General transfer comment: incr time due to rib pain    Balance Overall balance assessment: Needs  assistance Sitting-balance support: No upper extremity supported;Feet supported Sitting balance-Leahy Scale: Good     Standing balance support: During functional activity;No upper extremity supported Standing balance-Leahy Scale: Good Standing balance comment: No UE support needed.                            ADL either performed or assessed with clinical judgement   ADL Overall ADL's : Needs assistance/impaired Eating/Feeding: Set up   Grooming: Applying deodorant   Upper Body Bathing: Supervision/ safety   Lower Body Bathing: Supervison/ safety   Upper Body Dressing : Supervision/safety   Lower Body Dressing: Supervision/safety Lower Body Dressing Details (indicate cue type and reason): able to figure 4 Toilet Transfer: Supervision/safety           Functional mobility during ADLs: Supervision/safety       Vision         Perception     Praxis      Pertinent Vitals/Pain Pain Assessment: Faces Faces Pain Scale: Hurts even more Pain Location: R UE at the elbow Pain Descriptors / Indicators: Grimacing;Guarding;Sore     Hand Dominance Right   Extremity/Trunk Assessment Upper Extremity Assessment Upper Extremity Assessment: RUE deficits/detail RUE Deficits / Details: pt with Shoulder flexion and elbow flexion so that hand is on head and reports no pai in this position RUE: Unable to fully assess due to pain RUE Coordination: WNL   Lower Extremity Assessment  Lower Extremity Assessment: Defer to PT evaluation   Cervical / Trunk Assessment Cervical / Trunk Assessment: Normal   Communication Communication Communication: No difficulties   Cognition Arousal/Alertness: Awake/alert Behavior During Therapy: WFL for tasks assessed/performed Overall Cognitive Status: Within Functional Limits for tasks assessed                                     General Comments  educated on spirometer for home    Exercises     Shoulder  Instructions      Home Living Family/patient expects to be discharged to:: Private residence Living Arrangements: Spouse/significant other Available Help at Discharge: Family;Available 24 hours/day(daughter there at night, wife recent spinal fusion) Type of Home: House Home Access: Stairs to enter [employer] of Steps: 4 Entrance Stairs-Rails: Right;Left;Can reach both Home Layout: One level     Bathroom Shower/Tub: Producer, television/film/video: Standard     Home Equipment: Shower seat - built in;Hand held shower head   Additional Comments: wife currently with ccollar and L foot boot currently . wife can only call 911 for (A)      Prior Functioning/Environment Level of Independence: Independent        Comments: Pt drives, did yardwork        OT Problem List:        OT Treatment/Interventions:      OT Goals(Current goals can be found in the care plan section) Acute Rehab OT Goals Patient Stated Goal: to go home today Potential to Achieve Goals: Good  OT Frequency:     Barriers to D/C:            Co-evaluation              AM-PAC PT "6 Clicks" Daily Activity     Outcome Measure Help from another person eating meals?: None Help from another person taking care of personal grooming?: None Help from another person toileting, which includes using toliet, bedpan, or urinal?: None Help from another person bathing (including washing, rinsing, drying)?: None Help from another person to put on and taking off regular upper body clothing?: None Help from another person to put on and taking off regular lower body clothing?: None 6 Click Score: 24   End of Session Nurse Communication: Mobility status;Precautions  Activity Tolerance: Patient tolerated treatment well Patient left: in chair;with call bell/phone within reach  OT Visit Diagnosis: Unsteadiness on feet (R26.81)                Time: 4098-1191 OT Time Calculation (min): 16 min Charges:   OT General Charges $OT Visit: 1 Visit OT Evaluation $OT Eval Low Complexity: 1 Low   Jacob Macdonald, OTR/L  Acute Rehabilitation Services Pager: [phone] Office: [phone] .   Jacob Macdonald 12/15/2017, 4:07 PM

## 2017-12-15 NOTE — Progress Notes (Signed)
[facility] Progress Note     Subjective: CC: left chest pain Patient reports pain in left rib cage from rib fractures, felt like pain was well controlled yesterday. Not on home O2, discussed working with PT/OT again today. Denies SOB. Denies abdominal pain. Lives at home with wife, daughter and grandson. Patient sees a Dr. Chilton Si at the [facility] for primary care.   Objective: Vital signs in last 24 hours: Temp:  [98.2 F (36.8 C)-98.4 F (36.9 C)] 98.4 F (36.9 C) (10/18 0457) Pulse Rate:  [66-84] 84 (10/18 0457) Resp:  [17-18] 17 (10/18 0457) BP: (148-176)/(66-78) 176/78 (10/18 0457) SpO2:  [94 %-98 %] 94 % (10/18 0457) Last BM Date: 12/10/17  Intake/Output from previous day: 10/17 0701 - 10/18 0700 In: 1373.1 [P.O.:700; I.V.:673.1] Out: -  Intake/Output this shift: No intake/output data recorded.  PE: Gen:  Alert, NAD, pleasant Card:  Regular rate and rhythm, pedal pulses 2+ BL Pulm:  Normal effort, clear to auscultation bilaterally, TTP of left chest wall, pulled 1000 on IS Abd: Soft, non-tender, non-distended, bowel sounds present Skin: warm and dry, no rashes  Psych: A&Ox3   Lab Results:  Recent Labs    12/13/17 1813 12/15/17 0239  WBC 10.2 7.6  HGB 13.3 11.7*  HCT 42.4 38.6*  PLT 268 227   BMET Recent Labs    12/13/17 1813 12/15/17 0239  NA 137 138  K 4.1 3.5  CL 101 103  CO2 24 24  GLUCOSE 244* 101*  BUN 11 8  CREATININE 0.85 0.83  CALCIUM 9.0 8.9   PT/INR No results for input(s): LABPROT, INR in the last 72 hours. CMP     Component Value Date/Time   NA 138 12/15/2017 0239   K 3.5 12/15/2017 0239   CL 103 12/15/2017 0239   CO2 24 12/15/2017 0239   GLUCOSE 101 (H) 12/15/2017 0239   BUN 8 12/15/2017 0239   CREATININE 0.83 12/15/2017 0239   CALCIUM 8.9 12/15/2017 0239   GFRNONAA >60 12/15/2017 0239   GFRAA >60 12/15/2017 0239   Lipase  No results found for: LIPASE     Studies/Results: Dg Chest 2 View  Result Date:  12/13/2017 CLINICAL DATA:  Fall yesterday EXAM: CHEST - 2 VIEW COMPARISON:  07/24/2015 FINDINGS: Lungs are under aerated with bibasilar atelectasis. The heart is normal in size. No pneumothorax or pleural effusion. No definite acute rib fracture. IMPRESSION: Bibasilar atelectasis. Electronically Signed   By: Jolaine Click M.D.   On: 12/13/2017 19:21   Dg Pelvis 1-2 Views  Result Date: 12/13/2017 CLINICAL DATA:  Patient fell 5 feet off ladder landing on right hip. Pain. EXAM: PELVIS - 1-2 VIEW COMPARISON:  None. FINDINGS: There is no evidence of pelvic fracture or diastasis. No pelvic bone lesions are seen. Lower lumbar facet arthropathy seen at L5-S1 on the right. Both hip joints are maintained with slight joint space narrowing bilaterally. No acute fracture of the bony pelvis and either hip. IMPRESSION: No acute osseous abnormality of the pelvis and included hips. Lower lumbar facet arthropathy L5-S1 on the right. Mild degenerative joint space narrowing of both hips. Electronically Signed   By: Tollie Eth M.D.   On: 12/13/2017 19:22   Ct Head Wo Contrast  Result Date: 12/13/2017 CLINICAL DATA:  Fall from ladder with back pain.  Initial encounter. EXAM: CT HEAD WITHOUT CONTRAST CT CERVICAL SPINE WITHOUT CONTRAST TECHNIQUE: Multidetector CT imaging of the head and cervical spine was performed following the standard protocol without intravenous contrast. Multiplanar  CT image reconstructions of the cervical spine were also generated. COMPARISON:  07/24/2015 FINDINGS: CT HEAD FINDINGS Brain: No evidence of acute infarction, hemorrhage, hydrocephalus, extra-axial collection or mass lesion/mass effect. Remote lacunar infarct in the left thalamus, also seen in 2017. Mild cerebral volume loss that is generalized Vascular: No hyperdense vessel or unexpected calcification. Skull: Negative for fracture.  Patchy scalp scarring. Sinuses/Orbits: No evidence of injury CT CERVICAL SPINE FINDINGS Alignment: Normal Skull  base and vertebrae: Negative for fracture Soft tissues and spinal canal: No prevertebral fluid or swelling. No visible canal hematoma. Disc levels: Spondylosis. No evidence of degenerative cord impingement Upper chest: Reported separately IMPRESSION: No evidence of intracranial or cervical spine injury. Electronically Signed   By: Marnee Spring M.D.   On: 12/13/2017 20:17   Ct Chest W Contrast  Result Date: 12/13/2017 CLINICAL DATA:  Fall from ladder yesterday. EXAM: CT CHEST, ABDOMEN, AND PELVIS WITH CONTRAST TECHNIQUE: Multidetector CT imaging of the chest, abdomen and pelvis was performed following the standard protocol during bolus administration of intravenous contrast. CONTRAST:  ISOVUE-300 IOPAMIDOL (ISOVUE-300) INJECTION 61% COMPARISON:  None. FINDINGS: CT CHEST FINDINGS Cardiovascular: Atherosclerotic calcifications of the aorta are noted. No evidence of aortic rupture or aneurysm. No evidence of dissection or intramural hematoma. Mild atherosclerotic changes of the proximal great vessels are present. Mild 3 vessel coronary artery calcifications. Mediastinum/Nodes: No abnormal mediastinal adenopathy. No pericardial effusion. No evidence of mediastinal hemorrhage. Lungs/Pleura: No pneumothorax. Small left pleural effusion. Dependent atelectasis bilaterally. Musculoskeletal: No vertebral compression deformity. Acute fractures of the lateral left third, 4, fifth, sixth, seventh, and eighth ribs are noted. CT ABDOMEN PELVIS FINDINGS Hepatobiliary: Unremarkable Pancreas: Unremarkable Spleen: Unremarkable Adrenals/Urinary Tract: Adrenal glands are within normal limits. Kidneys are unremarkable. Bladder is distended. Stomach/Bowel: No obvious mass in the colon. No evidence of small-bowel obstruction. Unremarkable stomach. Vascular/Lymphatic: Aortic atherosclerotic calcifications and predominately soft plaque. Atherosclerotic changes of the iliac arteries are also noted. No abnormal retroperitoneal  adenopathy. Reproductive: Prostate is enlarged. Other: No free fluid.  No evidence of hemoperitoneum. Musculoskeletal: No vertebral compression deformity. IMPRESSION: Multiple minimally displaced fractures of the left third, fourth, fifth, 6, seventh, and eighth ribs. No pneumothorax. No evidence of organ injury in the abdomen or pelvis. Bladder distention. Electronically Signed   By: Jolaine Click M.D.   On: 12/13/2017 20:23   Ct Cervical Spine Wo Contrast  Result Date: 12/13/2017 CLINICAL DATA:  Fall from ladder with back pain.  Initial encounter. EXAM: CT HEAD WITHOUT CONTRAST CT CERVICAL SPINE WITHOUT CONTRAST TECHNIQUE: Multidetector CT imaging of the head and cervical spine was performed following the standard protocol without intravenous contrast. Multiplanar CT image reconstructions of the cervical spine were also generated. COMPARISON:  07/24/2015 FINDINGS: CT HEAD FINDINGS Brain: No evidence of acute infarction, hemorrhage, hydrocephalus, extra-axial collection or mass lesion/mass effect. Remote lacunar infarct in the left thalamus, also seen in 2017. Mild cerebral volume loss that is generalized Vascular: No hyperdense vessel or unexpected calcification. Skull: Negative for fracture.  Patchy scalp scarring. Sinuses/Orbits: No evidence of injury CT CERVICAL SPINE FINDINGS Alignment: Normal Skull base and vertebrae: Negative for fracture Soft tissues and spinal canal: No prevertebral fluid or swelling. No visible canal hematoma. Disc levels: Spondylosis. No evidence of degenerative cord impingement Upper chest: Reported separately IMPRESSION: No evidence of intracranial or cervical spine injury. Electronically Signed   By: Marnee Spring M.D.   On: 12/13/2017 20:17   Ct Abdomen Pelvis W Contrast  Result Date: 12/13/2017 CLINICAL DATA:  Fall from  ladder yesterday. EXAM: CT CHEST, ABDOMEN, AND PELVIS WITH CONTRAST TECHNIQUE: Multidetector CT imaging of the chest, abdomen and pelvis was performed  following the standard protocol during bolus administration of intravenous contrast. CONTRAST:  ISOVUE-300 IOPAMIDOL (ISOVUE-300) INJECTION 61% COMPARISON:  None. FINDINGS: CT CHEST FINDINGS Cardiovascular: Atherosclerotic calcifications of the aorta are noted. No evidence of aortic rupture or aneurysm. No evidence of dissection or intramural hematoma. Mild atherosclerotic changes of the proximal great vessels are present. Mild 3 vessel coronary artery calcifications. Mediastinum/Nodes: No abnormal mediastinal adenopathy. No pericardial effusion. No evidence of mediastinal hemorrhage. Lungs/Pleura: No pneumothorax. Small left pleural effusion. Dependent atelectasis bilaterally. Musculoskeletal: No vertebral compression deformity. Acute fractures of the lateral left third, 4, fifth, sixth, seventh, and eighth ribs are noted. CT ABDOMEN PELVIS FINDINGS Hepatobiliary: Unremarkable Pancreas: Unremarkable Spleen: Unremarkable Adrenals/Urinary Tract: Adrenal glands are within normal limits. Kidneys are unremarkable. Bladder is distended. Stomach/Bowel: No obvious mass in the colon. No evidence of small-bowel obstruction. Unremarkable stomach. Vascular/Lymphatic: Aortic atherosclerotic calcifications and predominately soft plaque. Atherosclerotic changes of the iliac arteries are also noted. No abnormal retroperitoneal adenopathy. Reproductive: Prostate is enlarged. Other: No free fluid.  No evidence of hemoperitoneum. Musculoskeletal: No vertebral compression deformity. IMPRESSION: Multiple minimally displaced fractures of the left third, fourth, fifth, 6, seventh, and eighth ribs. No pneumothorax. No evidence of organ injury in the abdomen or pelvis. Bladder distention. Electronically Signed   By: Jolaine Click M.D.   On: 12/13/2017 20:23   Dg Chest Port 1 View  Result Date: 12/15/2017 CLINICAL DATA:  Left pneumothorax.  Rib fractures. EXAM: PORTABLE CHEST 1 VIEW COMPARISON:  Chest radiograph yesterday.  Chest  CT 12/13/2017 FINDINGS: Previous questioned left apical pneumothorax is no longer visualized. Small left pleural effusion appears similar to prior exam. Increasing dependent atelectasis in both lungs. Unchanged heart size and mediastinal contours. Known left rib fractures better delineated on prior CT. IMPRESSION: Previous question left apical pneumothorax is no longer seen. Similar volume of left pleural fluid. Increasing bibasilar atelectasis. Electronically Signed   By: Narda Rutherford M.D.   On: 12/15/2017 06:45   Dg Chest Port 1 View  Result Date: 12/14/2017 CLINICAL DATA:  Left rib fractures EXAM: PORTABLE CHEST 1 VIEW COMPARISON:  12/13/2017 FINDINGS: Left rib fractures again noted. Small left effusion has developed in the interval. Progression of mild bibasilar atelectasis. Possible small left apical pneumothorax overlying the clavicle. Follow-up suggested. IMPRESSION: Left rib fractures. Progression of small left effusion. Possible small left apical pneumothorax. Progressive bibasilar atelectasis. Electronically Signed   By: Marlan Palau M.D.   On: 12/14/2017 10:09    Anti-infectives: Anti-infectives (From admission, onward)   None       Assessment/Plan Fall from ladder Left 3-8 rib fractures - repeat CXR this AM without PTX, sats in the 90s on RA - IS, multimodal pain control, pulmonary toileting T2DM - can resume metformin tonight, SSI for now HTN - home meds, prn lopressor  FEN: CM diet, SLIV VTE: SCDs, lovenox ID: no abx indicated Follow up: PCP  Dispo: PT/OT, pain control. Likely home this afternoon   LOS: 1 day    Wells Guiles , [facility] 12/15/2017, 8:12 AM Pager: [phone] Mon-Fri 7:00 am-4:30 pm Sat-Sun 7:00 am-11:30 am

## 2017-12-15 NOTE — Discharge Summary (Signed)
Physician Discharge Summary  Patient ID: Jacob Macdonald MRN: [id number] DOB/AGE: Jul 06, 1945 72 y.o.  Admit date: 12/13/2017 Discharge date: 12/15/2017  Discharge Diagnoses Patient Active Problem List   Diagnosis Date Noted  . Multiple rib fractures 12/13/2017    Consultants None  Procedures None  Hospital Course: Patient is a 72 yo male with a history of type 2 diabetes mellitus, GERD, and HTN presenting to the [facility] trauma center for a fall. Patient states that he was attempting to put a cover on his motor home 2 days ago on 12/12/17 around 3 pm and lost his step while climbing down a ladder which resulted in a fall from 5 feet. Patient states that he landed on grass on his right side including his shoulder. Patient states that the fall "knocked the wind out of him" afterwards and does report hitting his head, however denies LOC. Patient noted left sided chest pain immediately afterwards. Patient has been ambulatory since the fall. Patient did not seek medical attention on the day of the accident and was brought the following day (10/16) to the [facility] ED by his daughter. Patient takes aspirin 81 mg daily. Patient continued to complain of severe left sided chest pain that was worse with coughing, breathing deeply, burping and sitting up. He reported that the pain is not relieved by the pain medication he is on now. Patient stated that the pain improves when he raises his right arm above his head. Complained of right elbow pain 10/18, films negative for acute fracture. Patient evaluated by PT who recommended no follow up with intermittent supervision. Pain better controlled on day of discharged. Patient discharged in stable condition.    Allergies as of 12/15/2017      Reactions   Lisinopril Swelling   REACTION: swelling   Penicillins Other (See Comments)   REACTION: swelling Has patient had a PCN reaction causing immediate rash, facial/tongue/throat swelling, SOB or lightheadedness  with hypotension: unknown Has patient had a PCN reaction causing severe rash involving mucus membranes or skin necrosis: unknown Has patient had a PCN reaction that required hospitalization unknown Has patient had a PCN reaction occurring within the last 10 years: unknown If all of the above answers are "NO", then may proceed with Cephalosporin use.   Shellfish Allergy Nausea And Vomiting         Medication List    STOP taking these medications   HYDROcodone-acetaminophen 5-325 MG tablet Commonly known as:  NORCO/VICODIN     TAKE these medications   acetaminophen 500 MG tablet Commonly known as:  TYLENOL Take 2 tablets (1,000 mg total) by mouth every 6 (six) hours as needed for mild pain.   amLODipine 5 MG tablet Commonly known as:  NORVASC Take 5 mg by mouth daily.   aspirin EC 81 MG tablet Take 81 mg by mouth daily.   B-12 PO Take 1 tablet by mouth at bedtime.   B-6 PO Take 1 tablet by mouth at bedtime.   clonazePAM 1 MG tablet Commonly known as:  KLONOPIN Take 1 mg by mouth at bedtime.   gabapentin 100 MG capsule Commonly known as:  NEURONTIN Take 3 capsules (300 mg total) by mouth 3 (three) times daily.   glipiZIDE 10 MG tablet Commonly known as:  GLUCOTROL Take 10 mg by mouth 2 (two) times daily before a meal.   metFORMIN 500 MG 24 hr tablet Commonly known as:  GLUCOPHAGE-XR Take 1,000 mg by mouth 2 (two) times daily.   methocarbamol 500  MG tablet Commonly known as:  ROBAXIN Take 2 tablets (1,000 mg total) by mouth every 8 (eight) hours as needed for muscle spasms.   omeprazole 20 MG capsule Commonly known as:  PRILOSEC Take 20 mg by mouth daily.   oxyCODONE 5 MG immediate release tablet Commonly known as:  Oxy IR/ROXICODONE Take 1-2 tablets (5-10 mg total) by mouth every 4 (four) hours as needed for moderate pain.   rosuvastatin 10 MG tablet Commonly known as:  CRESTOR Take 5 mg by mouth daily.   simvastatin 80 MG tablet Commonly known as:   ZOCOR Take 40 mg by mouth at bedtime.        Follow-up Information    Primary Care Provider Follow up.   Why:  Follow up with your primary care provider in 2-3 weeks.        [facility] Follow up.   Why:  No follow up scheduled. Call as needed.  Contact information: [address] [place] [address] [phone]          Signed: Wells Guiles , [facility] 12/15/2017, 11:07 AM Pager: [phone] Mon-Fri 7:00 am-4:30 pm Sat-Sun 7:00 am-11:30 am

## 2017-12-15 NOTE — Progress Notes (Signed)
Physical Therapy Treatment Patient Details Name: Jacob Macdonald MRN: [id number] DOB: December 10, 1945 Today's Date: 12/15/2017    History of Present Illness Mr. Jacob Macdonald is a 72 yo male with a history of type 2 diabetes mellitus, GERD, and HTN presenting to the [facility] for a fall. Patient states that he was attempting to put a cover on his motor home 2 days ago on 12/12/17 around 3 pm and lost his step while climbing down a ladder which resulted in a fall from 5 feet. Patient states that he landed on grass on his right side including his shoulder. Patient states that the fall "knocked the wind out of him" afterwards and does report hitting his head, however denies LOC. Patient noted left sided chest pain immediately afterwards. Patient has been ambulatory since the fall. Patient did not seek medical attention on the day of the accident and was brought the following day (10/16) to the [facility] ED by his daughter. Sustained rib fractures left side 3-8.      PT Comments    Patient seen for mobility progression. Pt overall mod I/Supervision this session and tolerated activity well. Pt able to ambulate 423ft and ascend/descend 10 steps with SpO2 88% on RA. At rest SpO2 95% on RA. Pt educated on ambulation schedule and encouraged to use IS. Current plan remains appropriate.    Follow Up Recommendations  No PT follow up;Supervision - Intermittent     Equipment Recommendations  None recommended by PT    Recommendations for Other Services       Precautions / Restrictions Precautions Precautions: Fall Restrictions Weight Bearing Restrictions: No    Mobility  Bed Mobility               General bed mobility comments: pt OOB in chair upon arrival; log roll verbally reviewed  Transfers Overall transfer level: Modified independent Equipment used: None Transfers: Sit to/from Stand           General transfer comment: increased time and effort; no phsyical assist needed;  educated on use of pillow for comfort when transitioning sit/stand  Ambulation/Gait Ambulation/Gait assistance: Supervision Gait Distance (Feet): 400 Feet Assistive device: None Gait Pattern/deviations: Step-through pattern     General Gait Details: decreased cadence; no significant gait deviations/unsteadiness noted    Stairs Stairs: Yes Stairs assistance: Supervision Stair Management: One rail Right;Alternating pattern;Forwards Number of Stairs: 10 General stair comments: supervision for safety   Wheelchair Mobility    Modified Rankin (Stroke Patients Only)       Balance Overall balance assessment: Needs assistance Sitting-balance support: No upper extremity supported;Feet supported Sitting balance-Leahy Scale: Good     Standing balance support: During functional activity;No upper extremity supported Standing balance-Leahy Scale: Good                              Cognition Arousal/Alertness: Awake/alert Behavior During Therapy: WFL for tasks assessed/performed Overall Cognitive Status: Within Functional Limits for tasks assessed                                        Exercises      General Comments General comments (skin integrity, edema, etc.): SpO2 desat to 88% on RA with mobility      Pertinent Vitals/Pain Pain Assessment: Faces Faces Pain Scale: Hurts even more Pain Location: left ribs with transitional movements  and R UE (mostly around elbow) Pain Descriptors / Indicators: Grimacing;Guarding;Sore Pain Intervention(s): Monitored during session;Repositioned;Premedicated before session    Home Living                      Prior Function            PT Goals (current goals can now be found in the care plan section) Acute Rehab PT Goals Patient Stated Goal: to go home PT Goal Formulation: With patient Time For Goal Achievement: 12/28/17 Potential to Achieve Goals: Good Progress towards PT goals: Progressing  toward goals    Frequency    Min 3X/week      PT Plan Current plan remains appropriate    Co-evaluation              AM-PAC PT "6 Clicks" Daily Activity  Outcome Measure  Difficulty turning over in bed (including adjusting bedclothes, sheets and blankets)?: None Difficulty moving from lying on back to sitting on the side of the bed? : None Difficulty sitting down on and standing up from a chair with arms (e.g., wheelchair, bedside commode, etc,.)?: None Help needed moving to and from a bed to chair (including a wheelchair)?: None Help needed walking in hospital room?: A Little Help needed climbing 3-5 steps with a railing? : A Little 6 Click Score: 22    End of Session Equipment Utilized During Treatment: Gait belt Activity Tolerance: Patient tolerated treatment well Patient left: in chair;with call bell/phone within reach;with family/visitor present Nurse Communication: Mobility status PT Visit Diagnosis: Unsteadiness on feet (R26.81);Muscle weakness (generalized) (M62.81);Pain Pain - part of body: (ribs)     Time: 1610-9604 PT Time Calculation (min) (ACUTE ONLY): 21 min  Charges:  $Gait Training: 8-22 mins                     Erline Levine, PTA Acute Rehabilitation Services Pager: [phone] Office: [phone]     Carolynne Edouard 12/15/2017, 10:13 AM

## 2017-12-15 NOTE — Progress Notes (Signed)
OT NOTE  OT entering room and transport arriving to take patient. Ot to return and evaluate as appropriate later today.  Mateo Flow, OTR/L  Acute Rehabilitation Services Pager: [phone] Office: [phone] .

## 2018-02-28 HISTORY — PX: GIVENS CAPSULE STUDY: SHX5432

## 2018-09-24 ENCOUNTER — Encounter: Payer: Self-pay | Admitting: Gastroenterology

## 2018-09-27 ENCOUNTER — Other Ambulatory Visit: Payer: Self-pay

## 2019-07-16 IMAGING — CT CT CHEST W/ CM
2 of 5 series · 13 of 36 positions shown, 16 images · IV contrast (iopamidol)
Comparison: None.

CLINICAL DATA: Fall from ladder yesterday.

EXAM:
CT CHEST, ABDOMEN, AND PELVIS WITH CONTRAST
TECHNIQUE: Multidetector CT imaging of the chest, abdomen and pelvis was
performed following the standard protocol during bolus
administration of intravenous contrast.
CONTRAST:  100mL SACSWM-B11 IOPAMIDOL (SACSWM-B11) INJECTION 61%

[Series 2: cap with · axial · 0.79mm/px · z∈[+794,+1324]mm · 10 of 130 slices shown, 13 images]
[im 12/130  mediastinal]
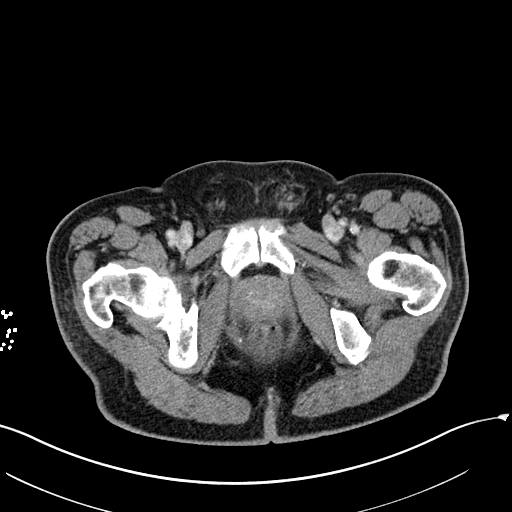
[im 12/130  lung]
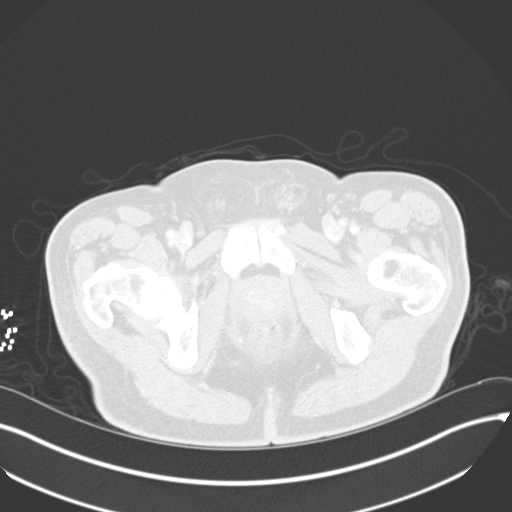
[im 24/130  lung]
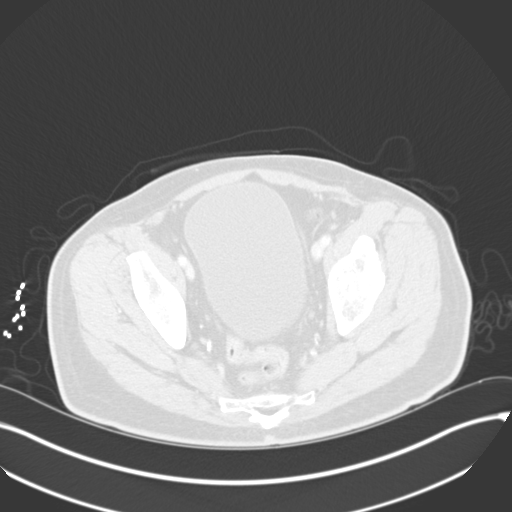
[im 36/130  lung]
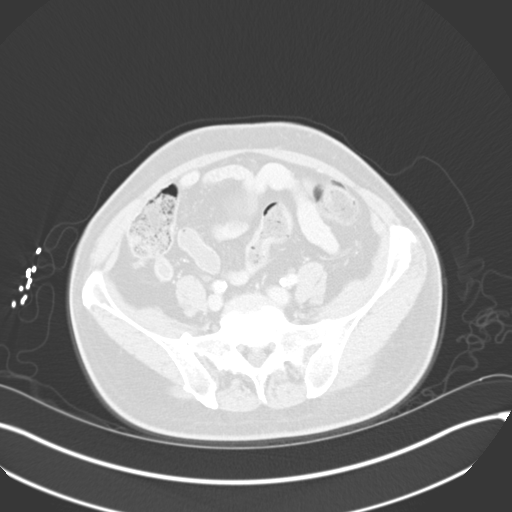
[im 47/130  lung]
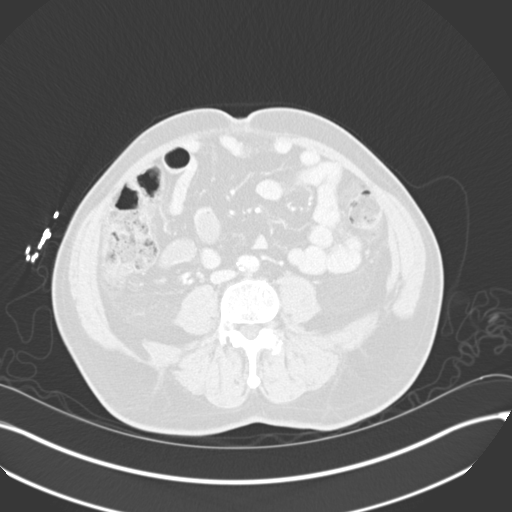
[im 59/130  mediastinal]
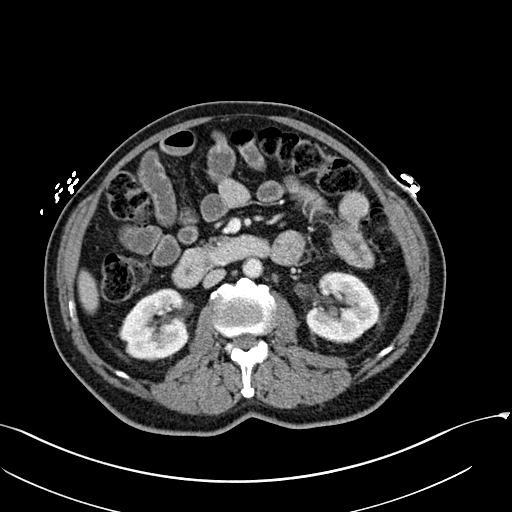
[im 59/130  lung]
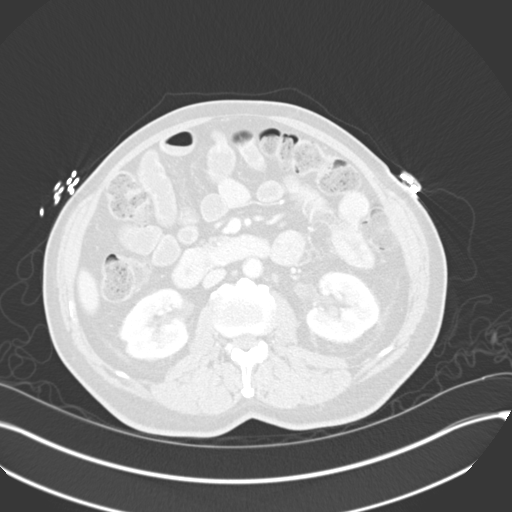
[im 71/130  lung]
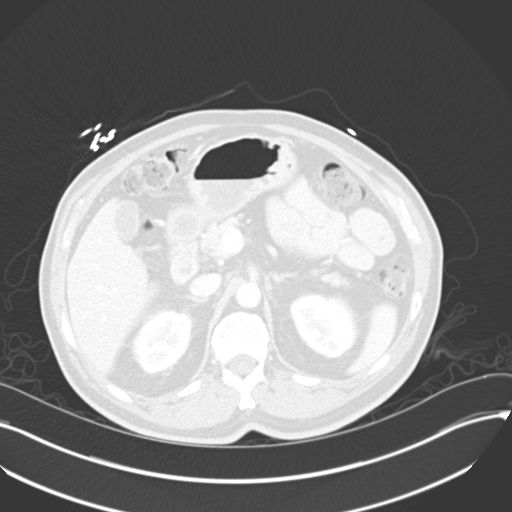
[im 83/130  lung]
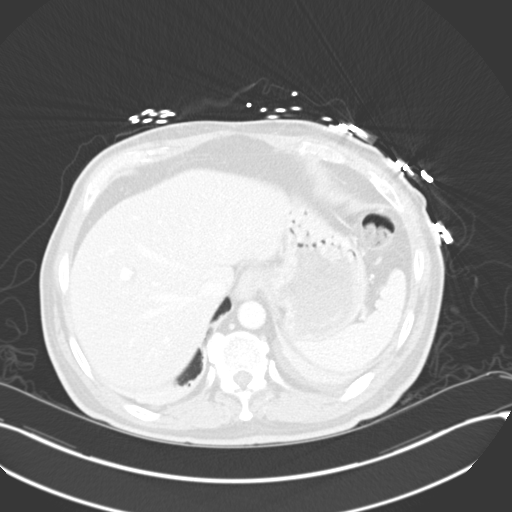
[im 94/130  lung]
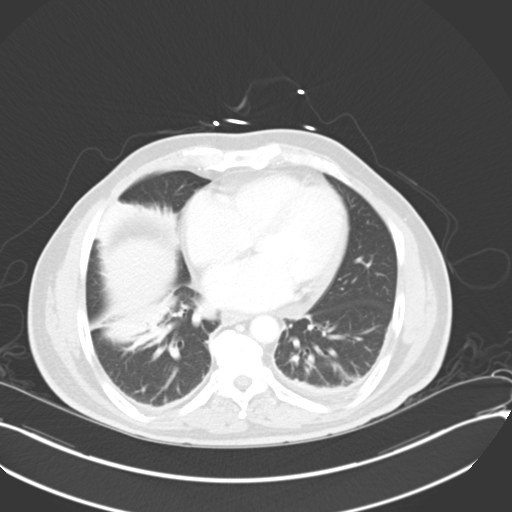
[im 106/130  mediastinal]
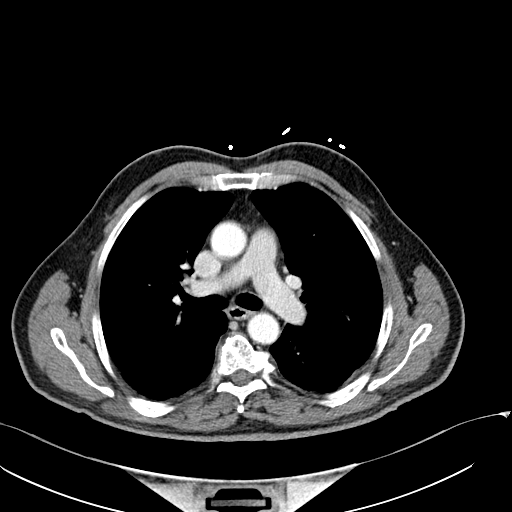
[im 106/130  lung]
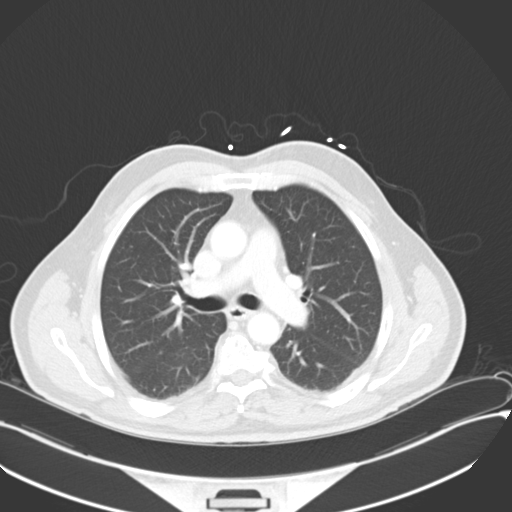
[im 118/130  lung]
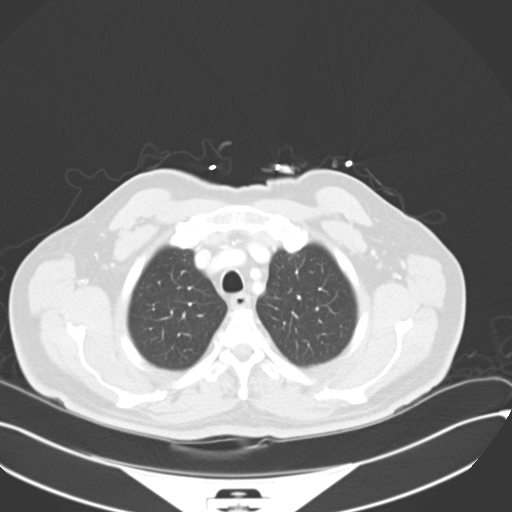

[Series 5: coronals · coronal · 0.81mm/px · 3 of 156 slices shown]
[im 32/156  lung]
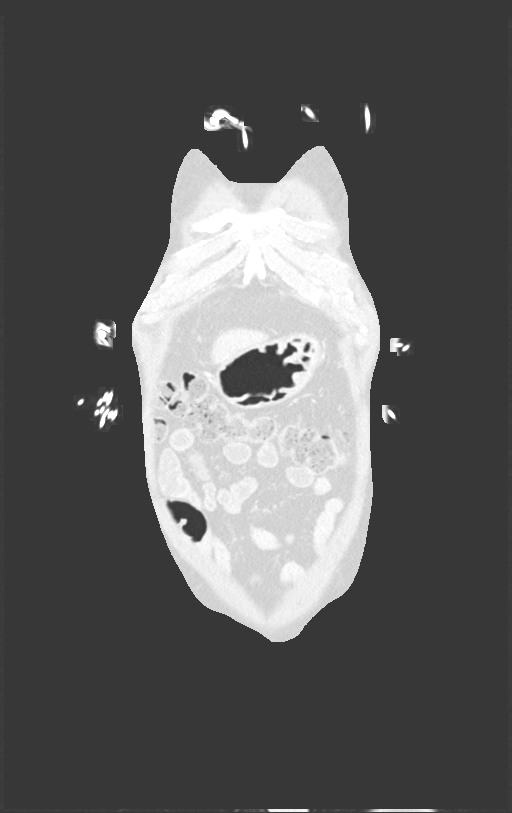
[im 63/156  lung]
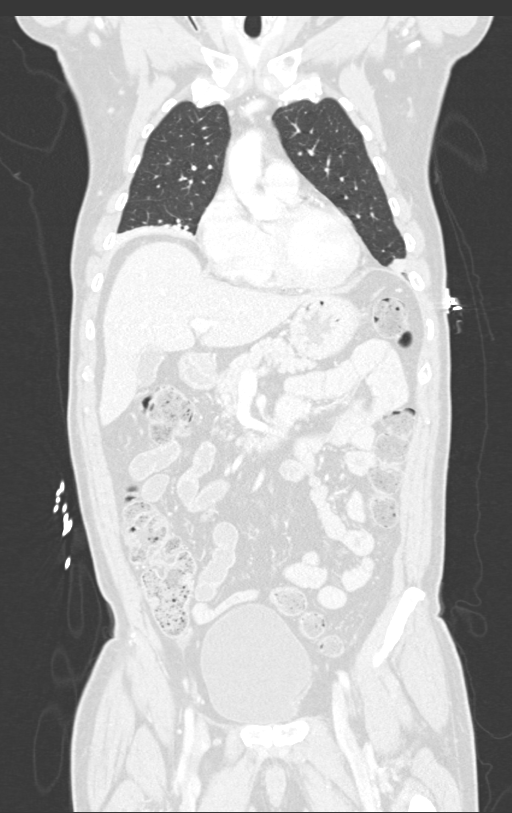
[im 94/156  lung]
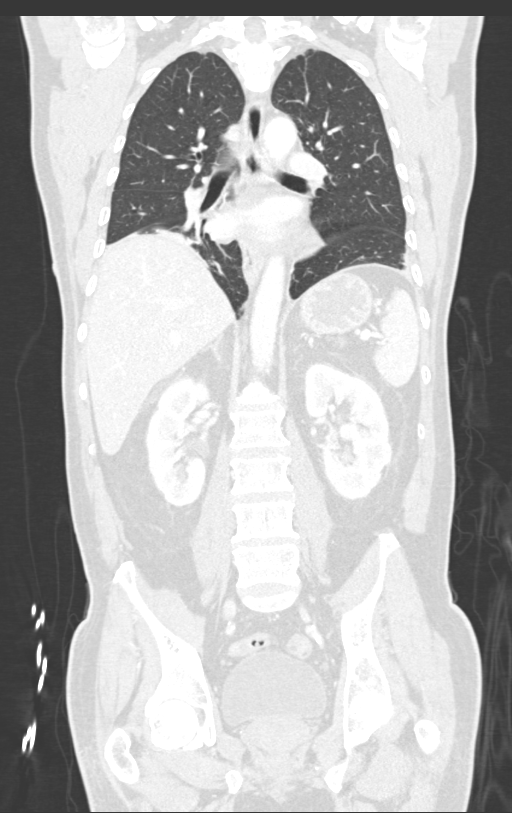

[13 of 36 positions shown; findings below may reference images not displayed]

FINDINGS: CT CHEST FINDINGS

Cardiovascular: Atherosclerotic calcifications of the aorta are
noted. No evidence of aortic rupture or aneurysm. No evidence of
dissection or intramural hematoma. Mild atherosclerotic changes of
the proximal great vessels are present. Mild 3 vessel coronary
artery calcifications.

Mediastinum/Nodes: No abnormal mediastinal adenopathy. No
pericardial effusion. No evidence of mediastinal hemorrhage.

Lungs/Pleura: No pneumothorax. Small left pleural effusion.
Dependent atelectasis bilaterally.

Musculoskeletal: No vertebral compression deformity. Acute fractures
of the lateral left third, 4, fifth, sixth, seventh, and eighth ribs
are noted.

CT ABDOMEN PELVIS FINDINGS

Hepatobiliary: Unremarkable

Pancreas: Unremarkable

Spleen: Unremarkable

Adrenals/Urinary Tract: Adrenal glands are within normal limits.
Kidneys are unremarkable. Bladder is distended.

Stomach/Bowel: No obvious mass in the colon. No evidence of
small-bowel obstruction. Unremarkable stomach.

Vascular/Lymphatic: Aortic atherosclerotic calcifications and
predominately soft plaque. Atherosclerotic changes of the iliac
arteries are also noted. No abnormal retroperitoneal adenopathy.

Reproductive: Prostate is enlarged.

Other: No free fluid.  No evidence of hemoperitoneum.

Musculoskeletal: No vertebral compression deformity.
IMPRESSION: Multiple minimally displaced fractures of the left third, fourth,
fifth, 6, seventh, and eighth ribs. No pneumothorax.

No evidence of organ injury in the abdomen or pelvis.

Bladder distention.

## 2019-09-24 ENCOUNTER — Other Ambulatory Visit: Payer: Self-pay

## 2019-09-26 DIAGNOSIS — R634 Abnormal weight loss: Secondary | ICD-10-CM | POA: Diagnosis not present

## 2019-09-26 DIAGNOSIS — I10 Essential (primary) hypertension: Secondary | ICD-10-CM | POA: Diagnosis not present

## 2019-09-26 DIAGNOSIS — E1165 Type 2 diabetes mellitus with hyperglycemia: Secondary | ICD-10-CM | POA: Diagnosis not present

## 2019-09-26 DIAGNOSIS — D500 Iron deficiency anemia secondary to blood loss (chronic): Secondary | ICD-10-CM | POA: Diagnosis not present

## 2019-09-26 DIAGNOSIS — E1169 Type 2 diabetes mellitus with other specified complication: Secondary | ICD-10-CM | POA: Diagnosis not present

## 2019-09-26 DIAGNOSIS — E782 Mixed hyperlipidemia: Secondary | ICD-10-CM | POA: Diagnosis not present

## 2019-10-11 DIAGNOSIS — D500 Iron deficiency anemia secondary to blood loss (chronic): Secondary | ICD-10-CM | POA: Diagnosis not present

## 2019-10-11 DIAGNOSIS — I10 Essential (primary) hypertension: Secondary | ICD-10-CM | POA: Diagnosis not present

## 2019-10-11 DIAGNOSIS — E1165 Type 2 diabetes mellitus with hyperglycemia: Secondary | ICD-10-CM | POA: Diagnosis not present

## 2019-10-11 DIAGNOSIS — E782 Mixed hyperlipidemia: Secondary | ICD-10-CM | POA: Diagnosis not present

## 2019-10-11 DIAGNOSIS — R634 Abnormal weight loss: Secondary | ICD-10-CM | POA: Diagnosis not present

## 2020-03-13 DIAGNOSIS — Z0000 Encounter for general adult medical examination without abnormal findings: Secondary | ICD-10-CM | POA: Diagnosis not present

## 2020-03-13 DIAGNOSIS — E1165 Type 2 diabetes mellitus with hyperglycemia: Secondary | ICD-10-CM | POA: Diagnosis not present

## 2020-03-13 DIAGNOSIS — E782 Mixed hyperlipidemia: Secondary | ICD-10-CM | POA: Diagnosis not present

## 2020-04-07 DIAGNOSIS — E782 Mixed hyperlipidemia: Secondary | ICD-10-CM | POA: Diagnosis not present

## 2020-04-07 DIAGNOSIS — I10 Essential (primary) hypertension: Secondary | ICD-10-CM | POA: Diagnosis not present

## 2020-04-07 DIAGNOSIS — E1169 Type 2 diabetes mellitus with other specified complication: Secondary | ICD-10-CM | POA: Diagnosis not present

## 2020-04-07 DIAGNOSIS — Z0000 Encounter for general adult medical examination without abnormal findings: Secondary | ICD-10-CM | POA: Diagnosis not present

## 2020-12-03 DIAGNOSIS — E782 Mixed hyperlipidemia: Secondary | ICD-10-CM | POA: Diagnosis not present

## 2020-12-03 DIAGNOSIS — E1165 Type 2 diabetes mellitus with hyperglycemia: Secondary | ICD-10-CM | POA: Diagnosis not present

## 2020-12-10 DIAGNOSIS — I10 Essential (primary) hypertension: Secondary | ICD-10-CM | POA: Diagnosis not present

## 2020-12-10 DIAGNOSIS — Z23 Encounter for immunization: Secondary | ICD-10-CM | POA: Diagnosis not present

## 2020-12-10 DIAGNOSIS — E782 Mixed hyperlipidemia: Secondary | ICD-10-CM | POA: Diagnosis not present

## 2020-12-10 DIAGNOSIS — E1165 Type 2 diabetes mellitus with hyperglycemia: Secondary | ICD-10-CM | POA: Diagnosis not present

## 2021-04-13 DIAGNOSIS — R5383 Other fatigue: Secondary | ICD-10-CM | POA: Diagnosis not present

## 2021-04-13 DIAGNOSIS — E782 Mixed hyperlipidemia: Secondary | ICD-10-CM | POA: Diagnosis not present

## 2021-04-13 DIAGNOSIS — I10 Essential (primary) hypertension: Secondary | ICD-10-CM | POA: Diagnosis not present

## 2021-04-13 DIAGNOSIS — E1165 Type 2 diabetes mellitus with hyperglycemia: Secondary | ICD-10-CM | POA: Diagnosis not present

## 2021-04-20 DIAGNOSIS — I10 Essential (primary) hypertension: Secondary | ICD-10-CM | POA: Diagnosis not present

## 2021-04-20 DIAGNOSIS — E782 Mixed hyperlipidemia: Secondary | ICD-10-CM | POA: Diagnosis not present

## 2021-04-20 DIAGNOSIS — E1165 Type 2 diabetes mellitus with hyperglycemia: Secondary | ICD-10-CM | POA: Diagnosis not present

## 2021-04-20 DIAGNOSIS — Z0000 Encounter for general adult medical examination without abnormal findings: Secondary | ICD-10-CM | POA: Diagnosis not present

## 2021-10-19 DIAGNOSIS — I10 Essential (primary) hypertension: Secondary | ICD-10-CM | POA: Diagnosis not present

## 2021-10-19 DIAGNOSIS — E782 Mixed hyperlipidemia: Secondary | ICD-10-CM | POA: Diagnosis not present

## 2021-10-19 DIAGNOSIS — E1165 Type 2 diabetes mellitus with hyperglycemia: Secondary | ICD-10-CM | POA: Diagnosis not present

## 2021-10-26 DIAGNOSIS — E1165 Type 2 diabetes mellitus with hyperglycemia: Secondary | ICD-10-CM | POA: Diagnosis not present

## 2021-10-26 DIAGNOSIS — E782 Mixed hyperlipidemia: Secondary | ICD-10-CM | POA: Diagnosis not present

## 2021-10-26 DIAGNOSIS — I10 Essential (primary) hypertension: Secondary | ICD-10-CM | POA: Diagnosis not present

## 2022-02-03 DIAGNOSIS — E782 Mixed hyperlipidemia: Secondary | ICD-10-CM | POA: Diagnosis not present

## 2022-02-03 DIAGNOSIS — E1165 Type 2 diabetes mellitus with hyperglycemia: Secondary | ICD-10-CM | POA: Diagnosis not present

## 2022-02-03 DIAGNOSIS — I10 Essential (primary) hypertension: Secondary | ICD-10-CM | POA: Diagnosis not present

## 2022-02-10 DIAGNOSIS — I10 Essential (primary) hypertension: Secondary | ICD-10-CM | POA: Diagnosis not present

## 2022-02-10 DIAGNOSIS — E1165 Type 2 diabetes mellitus with hyperglycemia: Secondary | ICD-10-CM | POA: Diagnosis not present

## 2022-02-10 DIAGNOSIS — E782 Mixed hyperlipidemia: Secondary | ICD-10-CM | POA: Diagnosis not present

## 2022-03-24 DIAGNOSIS — E1165 Type 2 diabetes mellitus with hyperglycemia: Secondary | ICD-10-CM | POA: Diagnosis not present

## 2022-03-24 DIAGNOSIS — E782 Mixed hyperlipidemia: Secondary | ICD-10-CM | POA: Diagnosis not present

## 2022-03-24 DIAGNOSIS — I10 Essential (primary) hypertension: Secondary | ICD-10-CM | POA: Diagnosis not present

## 2022-04-21 DIAGNOSIS — R197 Diarrhea, unspecified: Secondary | ICD-10-CM | POA: Diagnosis not present

## 2022-04-21 DIAGNOSIS — R109 Unspecified abdominal pain: Secondary | ICD-10-CM | POA: Diagnosis not present

## 2022-04-21 DIAGNOSIS — R051 Acute cough: Secondary | ICD-10-CM | POA: Diagnosis not present

## 2022-04-21 DIAGNOSIS — R509 Fever, unspecified: Secondary | ICD-10-CM | POA: Diagnosis not present

## 2022-04-28 DIAGNOSIS — R109 Unspecified abdominal pain: Secondary | ICD-10-CM | POA: Diagnosis not present

## 2022-04-28 DIAGNOSIS — R197 Diarrhea, unspecified: Secondary | ICD-10-CM | POA: Diagnosis not present

## 2022-05-19 ENCOUNTER — Inpatient Hospital Stay (HOSPITAL_COMMUNITY)
Admission: EM | Admit: 2022-05-19 | Discharge: 2022-05-21 | DRG: 812 | Disposition: A | Discharge status: Home / Self Care | Payer: PPO | Attending: Internal Medicine | Admitting: Internal Medicine

## 2022-05-19 ENCOUNTER — Other Ambulatory Visit: Payer: Self-pay

## 2022-05-19 DIAGNOSIS — Z888 Allergy status to other drugs, medicaments and biological substances status: Secondary | ICD-10-CM | POA: Diagnosis not present

## 2022-05-19 DIAGNOSIS — E119 Type 2 diabetes mellitus without complications: Secondary | ICD-10-CM | POA: Diagnosis not present

## 2022-05-19 DIAGNOSIS — E785 Hyperlipidemia, unspecified: Secondary | ICD-10-CM | POA: Diagnosis present

## 2022-05-19 DIAGNOSIS — I10 Essential (primary) hypertension: Secondary | ICD-10-CM

## 2022-05-19 DIAGNOSIS — G8929 Other chronic pain: Secondary | ICD-10-CM | POA: Diagnosis not present

## 2022-05-19 DIAGNOSIS — K649 Unspecified hemorrhoids: Secondary | ICD-10-CM | POA: Diagnosis not present

## 2022-05-19 DIAGNOSIS — E871 Hypo-osmolality and hyponatremia: Secondary | ICD-10-CM

## 2022-05-19 DIAGNOSIS — Z7982 Long term (current) use of aspirin: Secondary | ICD-10-CM | POA: Diagnosis not present

## 2022-05-19 DIAGNOSIS — E1165 Type 2 diabetes mellitus with hyperglycemia: Secondary | ICD-10-CM

## 2022-05-19 DIAGNOSIS — E876 Hypokalemia: Secondary | ICD-10-CM | POA: Diagnosis not present

## 2022-05-19 DIAGNOSIS — Z79899 Other long term (current) drug therapy: Secondary | ICD-10-CM

## 2022-05-19 DIAGNOSIS — F419 Anxiety disorder, unspecified: Secondary | ICD-10-CM | POA: Diagnosis not present

## 2022-05-19 DIAGNOSIS — E11649 Type 2 diabetes mellitus with hypoglycemia without coma: Secondary | ICD-10-CM | POA: Diagnosis not present

## 2022-05-19 DIAGNOSIS — K589 Irritable bowel syndrome without diarrhea: Secondary | ICD-10-CM | POA: Diagnosis not present

## 2022-05-19 DIAGNOSIS — F17210 Nicotine dependence, cigarettes, uncomplicated: Secondary | ICD-10-CM | POA: Diagnosis present

## 2022-05-19 DIAGNOSIS — K922 Gastrointestinal hemorrhage, unspecified: Secondary | ICD-10-CM | POA: Diagnosis not present

## 2022-05-19 DIAGNOSIS — D509 Iron deficiency anemia, unspecified: Secondary | ICD-10-CM | POA: Diagnosis not present

## 2022-05-19 DIAGNOSIS — D649 Anemia, unspecified: Principal | ICD-10-CM | POA: Diagnosis present

## 2022-05-19 DIAGNOSIS — Z880 Allergy status to penicillin: Secondary | ICD-10-CM | POA: Diagnosis not present

## 2022-05-19 DIAGNOSIS — R5383 Other fatigue: Secondary | ICD-10-CM | POA: Diagnosis not present

## 2022-05-19 DIAGNOSIS — E782 Mixed hyperlipidemia: Secondary | ICD-10-CM | POA: Diagnosis not present

## 2022-05-19 DIAGNOSIS — Z7984 Long term (current) use of oral hypoglycemic drugs: Secondary | ICD-10-CM

## 2022-05-19 DIAGNOSIS — F17200 Nicotine dependence, unspecified, uncomplicated: Secondary | ICD-10-CM | POA: Diagnosis not present

## 2022-05-19 DIAGNOSIS — K219 Gastro-esophageal reflux disease without esophagitis: Secondary | ICD-10-CM | POA: Diagnosis present

## 2022-05-19 DIAGNOSIS — Z8719 Personal history of other diseases of the digestive system: Secondary | ICD-10-CM

## 2022-05-19 LAB — URINALYSIS, ROUTINE W REFLEX MICROSCOPIC
Bacteria, UA: NONE SEEN
Bilirubin Urine: NEGATIVE
Glucose, UA: >=500 mg/dL — AB
Hgb urine dipstick: NEGATIVE
Ketones, ur: 5 mg/dL — AB
Leukocytes,Ua: NEGATIVE
Nitrite: NEGATIVE
Protein, ur: NEGATIVE mg/dL
Specific Gravity, Urine: 1.01 (ref 1.005–1.030)
pH: 5 (ref 5.0–8.0)

## 2022-05-19 LAB — IRON AND TIBC
Iron: 13 ug/dL — ABNORMAL LOW (ref 45–182)
Saturation Ratios: 3 % — ABNORMAL LOW (ref 17.9–39.5)
TIBC: 447 ug/dL (ref 250–450)
UIBC: 434 ug/dL

## 2022-05-19 LAB — CBC
HCT: 18.9 % — ABNORMAL LOW (ref 39.0–52.0)
Hemoglobin: 5.3 g/dL — CL (ref 13.0–17.0)
MCH: 17.2 pg — ABNORMAL LOW (ref 26.0–34.0)
MCHC: 28 g/dL — ABNORMAL LOW (ref 30.0–36.0)
MCV: 61.4 fL — ABNORMAL LOW (ref 80.0–100.0)
Platelets: 235 10*3/uL (ref 150–400)
RBC: 3.08 MIL/uL — ABNORMAL LOW (ref 4.22–5.81)
RDW: 20.5 % — ABNORMAL HIGH (ref 11.5–15.5)
WBC: 5.3 10*3/uL (ref 4.0–10.5)
nRBC: 0 % (ref 0.0–0.2)

## 2022-05-19 LAB — BASIC METABOLIC PANEL
Anion gap: 12 (ref 5–15)
BUN: 27 mg/dL — ABNORMAL HIGH (ref 8–23)
CO2: 20 mmol/L — ABNORMAL LOW (ref 22–32)
Calcium: 9.1 mg/dL (ref 8.9–10.3)
Chloride: 100 mmol/L (ref 98–111)
Creatinine, Ser: 1.4 mg/dL — ABNORMAL HIGH (ref 0.61–1.24)
GFR, Estimated: 52 mL/min — ABNORMAL LOW (ref >60)
Glucose, Bld: 134 mg/dL — ABNORMAL HIGH (ref 70–99)
Potassium: 3.6 mmol/L (ref 3.5–5.1)
Sodium: 132 mmol/L — ABNORMAL LOW (ref 135–145)

## 2022-05-19 LAB — GLUCOSE, CAPILLARY
Glucose-Capillary: 65 mg/dL — ABNORMAL LOW (ref 70–99)
Glucose-Capillary: 78 mg/dL (ref 70–99)

## 2022-05-19 LAB — FERRITIN: Ferritin: 3 ng/mL — ABNORMAL LOW (ref 24–336)

## 2022-05-19 LAB — ABO/RH: ABO/RH(D): A POS

## 2022-05-19 LAB — PREPARE RBC (CROSSMATCH)

## 2022-05-19 LAB — CBG MONITORING, ED: Glucose-Capillary: 114 mg/dL — ABNORMAL HIGH (ref 70–99)

## 2022-05-19 LAB — POC OCCULT BLOOD, ED: Fecal Occult Bld: POSITIVE — AB

## 2022-05-19 MED ORDER — SODIUM CHLORIDE 0.9% IV SOLUTION: Freq: Once | INTRAVENOUS | Status: AC

## 2022-05-19 MED ORDER — VITAMIN B-12 1000 MCG PO TABS
500.0000 ug | ORAL_TABLET | Freq: Every day | ORAL | Status: DC
  Administered 2022-05-20: 500 ug via ORAL
  Filled 2022-05-19: qty 1

## 2022-05-19 MED ORDER — PANTOPRAZOLE SODIUM 40 MG IV SOLR
40.0000 mg | Freq: Two times a day (BID) | INTRAVENOUS | Status: DC
  Administered 2022-05-19 – 2022-05-20 (×3): 40 mg via INTRAVENOUS
  Filled 2022-05-19 (×3): qty 10

## 2022-05-19 MED ORDER — ROSUVASTATIN CALCIUM 20 MG PO TABS
20.0000 mg | ORAL_TABLET | Freq: Every day | ORAL | Status: DC
  Administered 2022-05-20: 20 mg via ORAL
  Filled 2022-05-19 (×2): qty 1

## 2022-05-19 MED ORDER — VITAMIN B-6 100 MG PO TABS
100.0000 mg | ORAL_TABLET | Freq: Every day | ORAL | Status: DC
  Administered 2022-05-20: 100 mg via ORAL
  Filled 2022-05-19: qty 1

## 2022-05-19 MED ORDER — CLONAZEPAM 0.5 MG PO TABS
0.5000 mg | ORAL_TABLET | Freq: Every day | ORAL | Status: DC
  Administered 2022-05-19 – 2022-05-20 (×2): 0.5 mg via ORAL
  Filled 2022-05-19 (×2): qty 1

## 2022-05-19 MED ORDER — INSULIN ASPART 100 UNIT/ML IJ SOLN
0.0000 [IU] | Freq: Three times a day (TID) | INTRAMUSCULAR | Status: DC
  Administered 2022-05-21: 1 [IU] via SUBCUTANEOUS

## 2022-05-19 NOTE — Assessment & Plan Note (Signed)
-  Mild. Follow with repeat CBC in the morning after transfusions.

## 2022-05-19 NOTE — Assessment & Plan Note (Signed)
-  Last A1C OF 8.2% -place on sensitive SSI

## 2022-05-19 NOTE — Assessment & Plan Note (Signed)
-  Holding amlodipine with acute anemia

## 2022-05-19 NOTE — ED Triage Notes (Signed)
States went to his MD today for a annual and has blood work. Was called back by MD and told to come to ED for anemia. Endorses feeling sob, weakness and tired over the last few months. Hemoglobin 5.0

## 2022-05-19 NOTE — Assessment & Plan Note (Signed)
-  secondary to acute GI bleed -FOBT was positive with presenting Hgb of 5.3. No recent prior to compare baseline. -has hx of occult GI bleed.  In 2014 he was evaluated at [facility] and had EGD, colonoscopy and PillCam that was negative. Had workup in 2020 with EGD and colonoscopy that was also within normal limits. Unable to locate full report in our system.  -continue IV PPI q12hr -transfuse 2 upRBC and follow H/H -ED physician has consulted with GI Dr. Benson Norway who will see in consultation in the morning -clear liquid diet now and then NPO midnight

## 2022-05-19 NOTE — ED Provider Notes (Signed)
[facility] Provider Note  CSN: [id number] Arrival date & time: 05/19/22 1613  Chief Complaint(s) Anemia (States went to his MD today for a annual and has blood work. Was called back by MD and told to come to ED for anemia. )  HPI Jacob Macdonald is a 77 y.o. male who PMH T2DM, HTN, anemia who presents emergency department for evaluation of fatigue, dizziness and anemia.  Patient states that he had been previously worked up by gastroenterologist at the [facility] and had a PillCam but was unable to find the source of his low blood counts.  He states that the last 1 month has been difficult with progressive fatigue, dizziness and presyncope.  He denies black stools but wife is concerned because the patient has had a 20 pound weight loss over the last 1 year.  Patient currently denies any chest pain, shortness of breath, Donnell pain, nausea, vomiting or other systemic symptoms.   Past Medical History Past Medical History:  Diagnosis Date   Diabetes mellitus without complication ([place])    Distal radius fracture, left    GERD (gastroesophageal reflux disease)    History of hiatal hernia    Hypertension    Patient Active Problem List   Diagnosis Date Noted   Multiple rib fractures 12/13/2017   Home Medication(s) Prior to Admission medications   Medication Sig Start Date End Date Taking? Authorizing Provider  acetaminophen (TYLENOL) 500 MG tablet Take 2 tablets (1,000 mg total) by mouth every 6 (six) hours as needed for mild pain. 12/15/17   Norm Parcel, PA-C  amLODipine (NORVASC) 5 MG tablet Take 5 mg by mouth daily.    [provider]  aspirin EC 81 MG tablet Take 81 mg by mouth daily.    [provider]  clonazePAM (KLONOPIN) 1 MG tablet Take 1 mg by mouth at bedtime.  07/23/15   [provider]  Cyanocobalamin (B-12 PO) Take 1 tablet by mouth at bedtime.    [provider]  gabapentin (NEURONTIN) 100 MG  capsule Take 3 capsules (300 mg total) by mouth 3 (three) times daily. 12/15/17   Norm Parcel, PA-C  glipiZIDE (GLUCOTROL) 10 MG tablet Take 10 mg by mouth 2 (two) times daily before a meal.    [provider]  metFORMIN (GLUCOPHAGE-XR) 500 MG 24 hr tablet Take 1,000 mg by mouth 2 (two) times daily.    [provider]  methocarbamol (ROBAXIN) 500 MG tablet Take 2 tablets (1,000 mg total) by mouth every 8 (eight) hours as needed for muscle spasms. 12/15/17   Norm Parcel, PA-C  omeprazole (PRILOSEC) 20 MG capsule Take 20 mg by mouth daily. 06/09/15   [provider]  oxyCODONE (OXY IR/ROXICODONE) 5 MG immediate release tablet Take 1-2 tablets (5-10 mg total) by mouth every 4 (four) hours as needed for moderate pain. 12/15/17   Norm Parcel, PA-C  Pyridoxine HCl (B-6 PO) Take 1 tablet by mouth at bedtime.    [provider]  rosuvastatin (CRESTOR) 10 MG tablet Take 5 mg by mouth daily.    [provider]  simvastatin (ZOCOR) 80 MG tablet Take 40 mg by mouth at bedtime. 06/06/15   [provider]  Past Surgical History Past Surgical History:  Procedure Laterality Date   HERNIA REPAIR     OPEN REDUCTION INTERNAL FIXATION (ORIF) DISTAL RADIAL FRACTURE Left 08/03/2015   Procedure: OPEN TREATMENT OF LEFT DISTAL RADIUS FRACTURE;  Surgeon: Milly Jakob, MD;  Location: [facility];  Service: Orthopedics;  Laterality: Left;  GENERAL ANESTHESIA WITH PRE-OP BLOCK   SHOULDER ARTHROSCOPY Right    Family History No family history on file.  Social History Social History   Tobacco Use   Smoking status: Every Day    Packs/day: 1    Types: Cigarettes  Substance Use Topics   Alcohol use: No   Drug use: No   Allergies Lisinopril, Penicillins, and Shellfish allergy  Review of Systems Review of  Systems  Constitutional:  Positive for fatigue.  Neurological:  Positive for light-headedness.    Physical Exam Vital Signs  I have reviewed the triage vital signs BP (!) 146/48   Pulse 78   Temp 98.1 F (36.7 C) (Oral)   Resp 12   Ht 5\' 9"  (1.753 m)   Wt 65.8 kg   SpO2 99%   BMI 21.41 kg/m   Physical Exam Constitutional:      General: He is not in acute distress.    Appearance: Normal appearance.  HENT:     Head: Normocephalic and atraumatic.     Nose: No congestion or rhinorrhea.  Eyes:     General:        Right eye: No discharge.        Left eye: No discharge.     Extraocular Movements: Extraocular movements intact.     Pupils: Pupils are equal, round, and reactive to light.  Cardiovascular:     Rate and Rhythm: Normal rate and regular rhythm.     Heart sounds: No murmur heard. Pulmonary:     Effort: No respiratory distress.     Breath sounds: No wheezing or rales.  Abdominal:     General: There is no distension.     Tenderness: There is no abdominal tenderness.  Genitourinary:    Rectum: Guaiac result positive.  Musculoskeletal:        General: Normal range of motion.     Cervical back: Normal range of motion.  Skin:    General: Skin is warm and dry.  Neurological:     General: No focal deficit present.     Mental Status: He is alert.     ED Results and Treatments Labs (all labs ordered are listed, but only abnormal results are displayed) Labs Reviewed  BASIC METABOLIC PANEL - Abnormal; Notable for the following components:      Result Value   Sodium 132 (*)    CO2 20 (*)    Glucose, Bld 134 (*)    BUN 27 (*)    Creatinine, Ser 1.40 (*)    GFR, Estimated 52 (*)    All other components within normal limits  CBC - Abnormal; Notable for the following components:   RBC 3.08 (*)    Hemoglobin 5.3 (*)    HCT 18.9 (*)    MCV 61.4 (*)    MCH 17.2 (*)    MCHC 28.0 (*)    RDW 20.5 (*)    All other components within normal limits  URINALYSIS,  ROUTINE W REFLEX MICROSCOPIC - Abnormal; Notable for the following components:   Glucose, UA >=500 (*)    Ketones, ur 5 (*)    All other components within normal limits  CBG MONITORING, ED - Abnormal; Notable for the following components:   Glucose-Capillary 114 (*)    All other components within normal limits  POC OCCULT BLOOD, ED - Abnormal; Notable for the following components:   Fecal Occult Bld POSITIVE (*)    All other components within normal limits  IRON AND TIBC  FERRITIN  TYPE AND SCREEN  PREPARE RBC (CROSSMATCH)  ABO/RH                                                                                                                          Radiology No results found.  Pertinent labs & imaging results that were available during my care of the patient were reviewed by me and considered in my medical decision making (see MDM for details).  Medications Ordered in ED Medications  0.9 %  sodium chloride infusion (Manually program via Guardrails IV Fluids) (has no administration in time range)  pantoprazole (PROTONIX) injection 40 mg (has no administration in time range)                                                                                                                                     Procedures .Critical Care  Performed by: Teressa Lower, MD Authorized by: Teressa Lower, MD   Critical care provider statement:    Critical care time (minutes):  30   Critical care was necessary to treat or prevent imminent or life-threatening deterioration of the following conditions: Critical anemia requiring blood transfusion.   Critical care was time spent personally by me on the following activities:  Development of treatment plan with patient or surrogate, discussions with consultants, evaluation of patient's response to treatment, examination of patient, ordering and review of laboratory studies, ordering and review of radiographic studies, ordering and performing  treatments and interventions, pulse oximetry, re-evaluation of patient's condition and review of old charts   (including critical care time)  Medical Decision Making / ED Course   This patient presents to the ED for concern of anemia, fatigue, this involves an extensive number of treatment options, and is a complaint that carries with it a high risk of complications and morbidity.  The differential diagnosis includes upper GI bleed, lower GI bleed, iron deficiency anemia, anemia of chronic disease  MDM: Patient seen emergency room for evaluation of anemia and lightheadedness.  Physical exam large unremarkable but Hemoccult is positive.  Laboratory evaluation  is concerning with a hemoglobin of 5.3 with an MCV of 61.4.  Iron studies sent.  BUN 27, creatinine 1.4.  I spoke with Dr. Benson Norway of gastroenterology who will evaluate the patient while inpatient.  Pantoprazole initiated and 2 units packed red blood cells ordered.  Patient require hospitalization for suspected GI bleed and critical anemia requiring blood transfusion.   Additional history obtained: -Additional history obtained from wife -External records from outside source obtained and reviewed including: Chart review including previous notes, labs, imaging, consultation notes   Lab Tests: -I ordered, reviewed, and interpreted labs.   The pertinent results include:   Labs Reviewed  BASIC METABOLIC PANEL - Abnormal; Notable for the following components:      Result Value   Sodium 132 (*)    CO2 20 (*)    Glucose, Bld 134 (*)    BUN 27 (*)    Creatinine, Ser 1.40 (*)    GFR, Estimated 52 (*)    All other components within normal limits  CBC - Abnormal; Notable for the following components:   RBC 3.08 (*)    Hemoglobin 5.3 (*)    HCT 18.9 (*)    MCV 61.4 (*)    MCH 17.2 (*)    MCHC 28.0 (*)    RDW 20.5 (*)    All other components within normal limits  URINALYSIS, ROUTINE W REFLEX MICROSCOPIC - Abnormal; Notable for the  following components:   Glucose, UA >=500 (*)    Ketones, ur 5 (*)    All other components within normal limits  CBG MONITORING, ED - Abnormal; Notable for the following components:   Glucose-Capillary 114 (*)    All other components within normal limits  POC OCCULT BLOOD, ED - Abnormal; Notable for the following components:   Fecal Occult Bld POSITIVE (*)    All other components within normal limits  IRON AND TIBC  FERRITIN  TYPE AND SCREEN  PREPARE RBC (CROSSMATCH)  ABO/RH      EKG   EKG Interpretation  Date/Time:  Thursday May 19 2022 16:25:19 EDT Ventricular Rate:  87 PR Interval:  133 QRS Duration: 120 QT Interval:  396 QTC Calculation: 477 R Axis:   104 Text Interpretation: Sinus rhythm IVCD, consider atypical RBBB Confirmed by Jayron Maqueda ([id number]) on 05/19/2022 6:52:17 PM         Medicines ordered and prescription drug management: Meds ordered this encounter  Medications   0.9 %  sodium chloride infusion (Manually program via Guardrails IV Fluids)   pantoprazole (PROTONIX) injection 40 mg    -I have reviewed the patients home medicines and have made adjustments as needed  Critical interventions Packed red blood cell s  Consultations Obtained: I requested consultation with the gastroenterologist Dr. Benson Norway,  and discussed lab and imaging findings as well as pertinent plan - they recommend: Inpatient hospital evaluation   Cardiac Monitoring: The patient was maintained on a cardiac monitor.  I personally viewed and interpreted the cardiac monitored which showed an underlying rhythm of: NSR  Social Determinants of Health:  Factors impacting patients care include: none   Reevaluation: After the interventions noted above, I reevaluated the patient and found that they have :stayed the same  Co morbidities that complicate the patient evaluation  Past Medical History:  Diagnosis Date   Diabetes mellitus without complication ([place])    Distal radius  fracture, left    GERD (gastroesophageal reflux disease)    History of hiatal hernia    Hypertension  Dispostion: I considered admission for this patient, and due to critical anemia requiring blood transfusion suspected GI bleed patient require hospital admission     Final Clinical Impression(s) / ED Diagnoses Final diagnoses:  None     @PCDICTATION @    Teressa Lower, MD 05/19/22 [phone]

## 2022-05-19 NOTE — H&P (Signed)
History and Physical    Patient: Jacob Macdonald VFI:[id number] DOB: Feb 07, 1946 DOA: 05/19/2022 DOS: the patient was seen and examined on 05/19/2022 PCP: Patient, No Pcp Per  Patient coming from: Home  Chief Complaint:  Chief Complaint  Patient presents with   Anemia    States went to his MD today for a annual and has blood work. Was called back by MD and told to come to ED for anemia.    HPI: ALIOU MEALEY is a 77 y.o. male with medical history significant of HTN, T2DM, iron deficiency anemia, IBS, GERD, HLD who presents after routine blood work outpatient revealed severe acute anemia.   Has noticed months of progressive weakness and last night almost passed out in the shower from dizziness. Has increase work of breathing with exertion. Denies any dark stool. Not on iron supplementation but reports that [facility]. Has been taking diclofenac for past several months for back pain. Also on daily aspirin with last dose today. No alcohol use.   Pt receives most of his care at [facility] so not able to fully see all of his records. He was documented to have GI bleed in 2014 and 2020. In 2014 he was evaluated at [facility] and had EGD, colonoscopy and PillCam that was negative. Had workup in 2020 with EGD and colonoscopy that was also within normal limits.   In the ED, temp of 98.15F, BP of 155/51,HR 70S on room air.  Hgb of 5.3, MCV of 61, Plt wnl at 235. FOBT is positive.  Mild hyponatremia at 132, K of 3.6, Cr elevated at 1.40 with BUN of 27 which based on previous labs may be around his baseline.  He was given IV PPI and 2u pRBC was ordered to be transfused in ED. EDP consulted GI Dr. Benson Norway who will see in consultation.  Hospitalist then consulted for admission.    Review of Systems: As mentioned in the history of present illness. All other systems reviewed and are negative. Past Medical History:  Diagnosis Date   Diabetes  mellitus without complication ([facility])    Distal radius fracture, left    GERD (gastroesophageal reflux disease)    History of hiatal hernia    Hypertension    Past Surgical History:  Procedure Laterality Date   HERNIA REPAIR     OPEN REDUCTION INTERNAL FIXATION (ORIF) DISTAL RADIAL FRACTURE Left 08/03/2015   Procedure: OPEN TREATMENT OF LEFT DISTAL RADIUS FRACTURE;  Surgeon: Milly Jakob, MD;  Location: [place];  Service: Orthopedics;  Laterality: Left;  GENERAL ANESTHESIA WITH PRE-OP BLOCK   SHOULDER ARTHROSCOPY Right    Social History:  reports that he has been smoking cigarettes. He has been smoking an average of 1 pack per day. He does not have any smokeless tobacco history on file. He reports that he does not drink alcohol and does not use drugs.  Allergies  Allergen Reactions   Lisinopril Swelling    REACTION: swelling   Penicillins Other (See Comments)    REACTION: swelling Has patient had a PCN reaction causing immediate rash, facial/tongue/throat swelling, SOB or lightheadedness with hypotension: unknown Has patient had a PCN reaction causing severe rash involving mucus membranes or skin necrosis: unknown Has patient had a PCN reaction that required hospitalization unknown Has patient had a PCN reaction occurring within the last 10 years: unknown If all of the above answers are "NO", then may proceed with Cephalosporin  use.    Shellfish Allergy Nausea And Vomiting        No pertinent family history.  Prior to Admission medications   Medication Sig Start Date End Date Taking? Authorizing Provider  acetaminophen (TYLENOL) 500 MG tablet Take 2 tablets (1,000 mg total) by mouth every 6 (six) hours as needed for mild pain. 12/15/17   Norm Parcel, PA-C  amLODipine (NORVASC) 5 MG tablet Take 5 mg by mouth daily.    [provider]  aspirin EC 81 MG tablet Take 81 mg by mouth daily.    [provider]  clonazePAM (KLONOPIN) 1 MG tablet  Take 1 mg by mouth at bedtime.  07/23/15   [provider]  Cyanocobalamin (B-12 PO) Take 1 tablet by mouth at bedtime.    [provider]  gabapentin (NEURONTIN) 100 MG capsule Take 3 capsules (300 mg total) by mouth 3 (three) times daily. 12/15/17   Norm Parcel, PA-C  glipiZIDE (GLUCOTROL) 10 MG tablet Take 10 mg by mouth 2 (two) times daily before a meal.    [provider]  metFORMIN (GLUCOPHAGE-XR) 500 MG 24 hr tablet Take 1,000 mg by mouth 2 (two) times daily.    [provider]  methocarbamol (ROBAXIN) 500 MG tablet Take 2 tablets (1,000 mg total) by mouth every 8 (eight) hours as needed for muscle spasms. 12/15/17   Norm Parcel, PA-C  omeprazole (PRILOSEC) 20 MG capsule Take 20 mg by mouth daily. 06/09/15   [provider]  oxyCODONE (OXY IR/ROXICODONE) 5 MG immediate release tablet Take 1-2 tablets (5-10 mg total) by mouth every 4 (four) hours as needed for moderate pain. 12/15/17   Norm Parcel, PA-C  Pyridoxine HCl (B-6 PO) Take 1 tablet by mouth at bedtime.    [provider]  rosuvastatin (CRESTOR) 10 MG tablet Take 5 mg by mouth daily.    [provider]  simvastatin (ZOCOR) 80 MG tablet Take 40 mg by mouth at bedtime. 06/06/15   [provider]    Physical Exam: Vitals:   05/19/22 1715 05/19/22 1800 05/19/22 2000 05/19/22 2032  BP: (!) 157/48 (!) 146/48 (!) 159/61 (!) 143/60  Pulse: 77 78 86 89  Resp: 14 12 14 16   Temp:    98.6 F (37 C)  TempSrc:    Oral  SpO2: 100% 99% 100% 100%  Weight:      Height:       Constitutional: NAD, calm, comfortable, well-appearing elderly gentleman laying flat in bed Eyes: lids and conjunctivae normal ENMT: Mucous membranes are moist. Neck: normal, supple Respiratory: clear to auscultation bilaterally, no wheezing, no crackles. Normal respiratory effort. No accessory muscle use.  Cardiovascular: Regular rate and rhythm, no murmurs / rubs / gallops. No  extremity edema.  Abdomen: Soft, nondistended no tenderness, Bowel sounds positive.  Musculoskeletal: no clubbing / cyanosis. No joint deformity upper and lower extremities. Good ROM, no contractures. Normal muscle tone.  Skin: no rashes, lesions, ulcers. No induration Neurologic: CN 2-12 grossly intact.  Strength 5/5 in all 4.  Psychiatric: Normal judgment and insight. Alert and oriented x 3. Normal mood. Data Reviewed:  See HPI  Assessment and Plan: * Acute anemia -secondary to acute GI bleed -FOBT was positive with presenting Hgb of 5.3. No recent prior to compare baseline. -has hx of occult GI bleed.  In 2014 he was evaluated at [facility] and had EGD, colonoscopy and PillCam that was negative. Had workup in 2020 with EGD and  colonoscopy that was also within normal limits. Unable to locate full report in our system.  -continue IV PPI q12hr -transfuse 2 upRBC and follow H/H -ED physician has consulted with GI Dr. Benson Norway who will see in consultation in the morning -clear liquid diet now and then NPO midnight  Hyponatremia -Mild. Follow with repeat CBC in the morning after transfusions.   Essential hypertension -Holding amlodipine with acute anemia  Uncontrolled type 2 diabetes mellitus with hyperglycemia, without long-term current use of insulin (HCC) -Last A1C OF 8.2% -place on sensitive SSI       Advance Care Planning:   Code Status: Full Code   Consults: Dr. Benson Norway with GI consulted by EDP  Family Communication: WIFE at bedside  Severity of Illness: The appropriate patient status for this patient is INPATIENT. Inpatient status is judged to be reasonable and necessary in order to provide the required intensity of service to ensure the patient's safety. The patient's presenting symptoms, physical exam findings, and initial radiographic and laboratory data in the context of their chronic comorbidities is felt to place them at high risk for further clinical deterioration.  Furthermore, it is not anticipated that the patient will be medically stable for discharge from the hospital within 2 midnights of admission.   * I certify that at the point of admission it is my clinical judgment that the patient will require inpatient hospital care spanning beyond 2 midnights from the point of admission due to high intensity of service, high risk for further deterioration and high frequency of surveillance required.*  Author: Orene Desanctis, DO 05/19/2022 8:47 PM  For on call review [email or web address].

## 2022-05-20 ENCOUNTER — Encounter (HOSPITAL_COMMUNITY): Payer: Self-pay | Admitting: Family Medicine

## 2022-05-20 DIAGNOSIS — K589 Irritable bowel syndrome without diarrhea: Secondary | ICD-10-CM

## 2022-05-20 DIAGNOSIS — I10 Essential (primary) hypertension: Secondary | ICD-10-CM | POA: Diagnosis not present

## 2022-05-20 DIAGNOSIS — D509 Iron deficiency anemia, unspecified: Principal | ICD-10-CM

## 2022-05-20 DIAGNOSIS — D649 Anemia, unspecified: Secondary | ICD-10-CM

## 2022-05-20 DIAGNOSIS — K219 Gastro-esophageal reflux disease without esophagitis: Secondary | ICD-10-CM

## 2022-05-20 DIAGNOSIS — E11649 Type 2 diabetes mellitus with hypoglycemia without coma: Secondary | ICD-10-CM | POA: Diagnosis not present

## 2022-05-20 LAB — CBC
HCT: 24.6 % — ABNORMAL LOW (ref 39.0–52.0)
Hemoglobin: 7.4 g/dL — ABNORMAL LOW (ref 13.0–17.0)
MCH: 20.3 pg — ABNORMAL LOW (ref 26.0–34.0)
MCHC: 30.1 g/dL (ref 30.0–36.0)
MCV: 67.6 fL — ABNORMAL LOW (ref 80.0–100.0)
Platelets: 224 10*3/uL (ref 150–400)
RBC: 3.64 MIL/uL — ABNORMAL LOW (ref 4.22–5.81)
RDW: 27 % — ABNORMAL HIGH (ref 11.5–15.5)
WBC: 3.9 10*3/uL — ABNORMAL LOW (ref 4.0–10.5)
nRBC: 0.5 % — ABNORMAL HIGH (ref 0.0–0.2)

## 2022-05-20 LAB — BASIC METABOLIC PANEL
Anion gap: 10 (ref 5–15)
BUN: 20 mg/dL (ref 8–23)
CO2: 22 mmol/L (ref 22–32)
Calcium: 8.9 mg/dL (ref 8.9–10.3)
Chloride: 103 mmol/L (ref 98–111)
Creatinine, Ser: 1 mg/dL (ref 0.61–1.24)
GFR, Estimated: >60 mL/min (ref >60)
Glucose, Bld: 191 mg/dL — ABNORMAL HIGH (ref 70–99)
Potassium: 3.2 mmol/L — ABNORMAL LOW (ref 3.5–5.1)
Sodium: 135 mmol/L (ref 135–145)

## 2022-05-20 LAB — MAGNESIUM: Magnesium: 2.1 mg/dL (ref 1.7–2.4)

## 2022-05-20 LAB — GLUCOSE, CAPILLARY
Glucose-Capillary: 161 mg/dL — ABNORMAL HIGH (ref 70–99)
Glucose-Capillary: 58 mg/dL — ABNORMAL LOW (ref 70–99)
Glucose-Capillary: 108 mg/dL — ABNORMAL HIGH (ref 70–99)
Glucose-Capillary: 122 mg/dL — ABNORMAL HIGH (ref 70–99)
Glucose-Capillary: 157 mg/dL — ABNORMAL HIGH (ref 70–99)

## 2022-05-20 MED ORDER — DEXTROSE 5 % IV SOLN: INTRAVENOUS | Status: DC

## 2022-05-20 MED ORDER — POTASSIUM CHLORIDE 10 MEQ/100ML IV SOLN
10.0000 meq | INTRAVENOUS | Status: AC
  Administered 2022-05-20 (×4): 10 meq via INTRAVENOUS
  Filled 2022-05-20 (×4): qty 100

## 2022-05-20 MED ORDER — SODIUM CHLORIDE 0.9 % IV SOLN: 300.0000 mg | Freq: Once | INTRAVENOUS | Status: DC

## 2022-05-20 MED ORDER — SODIUM CHLORIDE 0.9 % IV SOLN
250.0000 mg | Freq: Every day | INTRAVENOUS | Status: AC
  Administered 2022-05-20 – 2022-05-21 (×2): 250 mg via INTRAVENOUS
  Filled 2022-05-20 (×2): qty 20

## 2022-05-20 MED ORDER — PEG-KCL-NACL-NASULF-NA ASC-C 100 G PO SOLR
0.5000 | Freq: Once | ORAL | Status: AC
  Administered 2022-05-20: 100 g via ORAL
  Filled 2022-05-20: qty 1

## 2022-05-20 MED ORDER — DEXTROSE 50 % IV SOLN
INTRAVENOUS | Status: AC
  Administered 2022-05-20: 50 mL
  Filled 2022-05-20: qty 50

## 2022-05-20 MED ORDER — PEG-KCL-NACL-NASULF-NA ASC-C 100 G PO SOLR: 1.0000 | Freq: Once | ORAL | Status: DC

## 2022-05-20 MED ORDER — PEG-KCL-NACL-NASULF-NA ASC-C 100 G PO SOLR
0.5000 | Freq: Once | ORAL | Status: AC
  Administered 2022-05-20: 100 g via ORAL

## 2022-05-20 NOTE — Progress Notes (Signed)
Hypoglycemic Event  CBG: 58  Treatment: D50 50 mL (25 gm)  Symptoms: None  Follow-up CBG: Time:0810 CBG Result:161  Possible Reasons for Event: Other: Patient NPO  Comments/MD notified:MD notified and aware of whole amp given.  [place]

## 2022-05-20 NOTE — Progress Notes (Signed)
PROGRESS NOTE    Jacob Macdonald  [id number] DOB: [birth year] DOA: 05/19/2022 PCP: Patient, No Pcp Per   Brief Narrative: 77 yo male  with medical history significant of HTN, T2DM, iron deficiency anemia, IBS, GERD, HLD who presents after routine blood work outpatient revealed severe acute anemia.Has noticed months of progressive weakness and sob and dizziness.Denies dark stool. Not on iron supplementation but reports that [facility] had [facility]. Has been taking diclofenac for past several months for back pain. Also on daily aspirin with last dose today. No alcohol use.  Pt receives most of his care at [facility]  He was documented to have GI bleed in 2014 and 2020.  In 2014 he was evaluated at [facility] and had EGD,  colonoscopy and PillCam that was negative. Had workup in 2020 with EGD and colonoscopy that was also within normal limits.  In the ED, temp of 98.68F, BP of 155/51,HR 70S on room air.  Hgb of 5.3, MCV of 61, Plt wnl at 235. FOBT is positive.  Mild hyponatremia at 132, K of 3.6, Cr elevated at 1.40 with BUN of 27 which based on previous labs may be around his baseline. He was given IV PPI and 2u pRBC was ordered to be transfused in ED. EDP consulted GI Dr. Benson Norway who will see in consultation.    Assessment & Plan:   Principal Problem:   Acute anemia Active Problems:   Acute GI bleeding   Uncontrolled type 2 diabetes mellitus with hyperglycemia, without long-term current use of insulin (HCC)   Essential hypertension   Hyponatremia   #1 acute GI bleed-patient presenting with generalized weakness dizziness and shortness of breath.  Admission hemoglobin was 5.3 he got 2 units of blood transfusion.  Follow-up H&H pending.  Started on PPI continue.  Dr. Benson Norway to see patient today for endoscopy.  Patient was taking diclofenac at home on a regular basis. Hold aspirin Anemia panel indicates iron deficiency Venofer ordered Repeat CBC is  still pending for some reason   #2 history of essential hypertension-on Norvasc at home Blood pressure 139/58  #3 type 2 diabetes-continue SSI On Jardiance glipizide and Glucophage at home on hold. CBG (last 3)  Recent Labs    05/19/22 2143 05/20/22 0731 05/20/22 0810  GLUCAP 78 58* 161*   Was hypoglycemic today received an amp of D50 and started D5 drip at 75 cc an hour. Check hemoglobin A1c  #4 hyperlipidemia on Crestor 20 mg daily.  #5 chronic pain on oxycodone and gabapentin and Robaxin at home on hold  #6 anxiety on Klonopin  #7 hypokalemia potassium 3.2 replete check mag level   Estimated body mass index is 21.41 kg/m as calculated from the following:   Height as of this encounter: 5\' 9"  (1.753 m).   Weight as of this encounter: 65.8 kg.  DVT prophylaxis: None due to GI bleed Code Status: Full code  family Communication: None at bedside  disposition Plan:  Status is: Inpatient Remains inpatient appropriate because: GI bleed   Consultants:  GI  Procedures: None  antimicrobials: None  Subjective: Resting in bed blood sugar was low this morning he states he does not feel any symptoms related to that no nausea vomiting  Objective: Vitals:   05/20/22 0244 05/20/22 0303 05/20/22 0517 05/20/22 0736  BP: (!) 135/52 139/62 (!) 131/57 (!) 139/58  Pulse: 67 71 64 67  Resp: 15 14 14 18   Temp: 98.2 F (  36.8 C) 98 F (36.7 C) 98.2 F (36.8 C) 97.8 F (36.6 C)  TempSrc: Oral Oral Oral Oral  SpO2: 98% 97% 97% 97%  Weight:      Height:        Intake/Output Summary (Last 24 hours) at 05/20/2022 0941 Last data filed at 05/20/2022 0517 Gross per 24 hour  Intake 1122.5 ml  Output 1300 ml  Net -177.5 ml   Filed Weights   05/19/22 1629  Weight: 65.8 kg    Examination:  General exam: Appears in no acute distress Respiratory system: Clear to auscultation. Respiratory effort normal. Cardiovascular system: S1 & S2 heard, RRR. No JVD, murmurs, rubs, gallops or  clicks. No pedal edema. Gastrointestinal system: Abdomen is nondistended, soft and nontender. No organomegaly or masses felt. Normal bowel sounds heard. Central nervous system: Alert and oriented. No focal neurological deficits. Extremities: No edema,. Psychiatry: Judgement and insight appear normal. Mood & affect appropriate.     Data Reviewed: I have personally reviewed following labs and imaging studies  CBC: Recent Labs  Lab 05/19/22 1645  WBC 5.3  HGB 5.3*  HCT 18.9*  MCV 61.4*  PLT [id number]   Basic Metabolic Panel: Recent Labs  Lab 05/19/22 1645 05/20/22 0758  NA 132* 135  K 3.6 3.2*  CL 100 103  CO2 20* 22  GLUCOSE 134* 191*  BUN 27* 20  CREATININE 1.40* 1.00  CALCIUM 9.1 8.9   GFR: Estimated Creatinine Clearance: 59.4 mL/min (by C-G formula based on SCr of 1 mg/dL). Liver Function Tests: No results for input(s): "AST", "ALT", "ALKPHOS", "BILITOT", "PROT", "ALBUMIN" in the last 168 hours. No results for input(s): "LIPASE", "AMYLASE" in the last 168 hours. No results for input(s): "AMMONIA" in the last 168 hours. Coagulation Profile: No results for input(s): "INR", "PROTIME" in the last 168 hours. Cardiac Enzymes: No results for input(s): "CKTOTAL", "CKMB", "CKMBINDEX", "TROPONINI" in the last 168 hours. BNP (last 3 results) No results for input(s): "PROBNP" in the last 8760 hours. HbA1C: No results for input(s): "HGBA1C" in the last 72 hours. CBG: Recent Labs  Lab 05/19/22 1730 05/19/22 2126 05/19/22 2143 05/20/22 0731 05/20/22 0810  GLUCAP 114* 65* 78 58* 161*   Lipid Profile: No results for input(s): "CHOL", "HDL", "LDLCALC", "TRIG", "CHOLHDL", "LDLDIRECT" in the last 72 hours. Thyroid Function Tests: No results for input(s): "TSH", "T4TOTAL", "FREET4", "T3FREE", "THYROIDAB" in the last 72 hours. Anemia Panel: Recent Labs    05/19/22 1723  FERRITIN 3*  TIBC 447  IRON 13*   Sepsis Labs: No results for input(s): "PROCALCITON", "LATICACIDVEN" in  the last 168 hours.  No results found for this or any previous visit (from the past 240 hour(s)).       Radiology Studies: No results found.      Scheduled Meds:  clonazePAM  0.5 mg Oral QHS   cyanocobalamin  500 mcg Oral QHS   insulin aspart  0-9 Units Subcutaneous TID WC   pantoprazole (PROTONIX) IV  40 mg Intravenous Q12H   pyridOXINE  100 mg Oral QHS   rosuvastatin  20 mg Oral Daily   Continuous Infusions:  dextrose 75 mL/hr at 05/20/22 0856     LOS: 1 day    Time spent: 36 min  Georgette Shell, MD 05/20/2022, 9:41 AM

## 2022-05-20 NOTE — Progress Notes (Signed)
  Transition of Care [facility]) Screening Note   Patient Details  Name: Jacob Macdonald Date of Birth: February 20, 1946   Transition of Care [facility]) CM/SW Contact:    Dessa Phi, RN Phone Number: 05/20/2022, 2:11 PM    Transition of Care Department [facility]) has reviewed patient and no TOC needs have been identified at this time. We will continue to monitor patient advancement through interdisciplinary progression rounds. If new patient transition needs arise, please place a TOC consult.

## 2022-05-20 NOTE — H&P (View-Only) (Signed)
Consultation Note   Referring Provider:  [facility] PCP: Patient, No Pcp Per Primary Gastroenterologist: Previously Dr. Deatra Ina but more recently the [facility]        Reason for consultation: iron deficiency anemia  Hospital Day: 2   Assessment   77 yo male with the following:  Recurrent , severe iron deficiency anemia / FOBT +.  Hemoglobin 5.3. No overt GI bleeding S/p 2u pRBCs. Follow up CBC pending Got dose of Ferrlecit  DM2   Plan  -K+ repletion in progress -Await post-transfusion hgb -Clear liquid diet -Probable EGD / colonoscopy tomorrow. The risks and benefits of EGD and colonoscopy with possible polypectomy / biopsies were discussed and the patient agrees to proceed.   ------------------------------------------------------------------------------------------  [place] [facility] Attending   I have taken an interval history, reviewed the chart and examined the patient. I agree with the Advanced Practitioner's note, impression and recommendations.    He has recurrent iron deficiency anemia and is heme +. This seem sto be chronic and recurrent with unrevealing w/u at [facility]. He says the took him off iron at some point.  Will reassess with EGD and colonoscopy and consider repeat capsule endoscopy if  they are negative.  The risks and benefits as well as alternatives of endoscopic procedure(s) have been discussed and reviewed. All questions answered. The patient agrees to proceed.  I appreciate the opportunity to care for you. Gatha Mayer, MD, [facility]  History of Present Illness   Patient is a 77 y.o. year old male with a past medical history of HTN, DM, IBS, GERD, iron deficiency anemia     See PMH for any additional medical problems.  Dimetrius has a remote history of iron deficiency anemia requring iron infusions through the [facility]. He hasn't needed oral or IV iron in years   Admitted with progressive weakness and anemia  ( labs with PCP). In ED labs notable for hgb of 5.3, MCV 61, K+ 3.2, Ferritin of 3. FOBT+  Admitting H+P mentions that patient has been taking Diclofenac for back pain. He adamantly denies taking Diclofenac for at least two years. Also admission note mentions had he had an EGD / colonoscopy and Pill camera in 2020 but patient is adamant that these were done more in the distant past. Apparently anemia hasn't been an issue for him in many years. Manoj has had loose stool for at least a year but stools haven't contained any blood and no black stools. He doesn't donate blood. No blood in urine. No epistaxis.  His weight has been stable over the last few months.    Per Care Everywhere:  Jan 2020 - EGD/ colonoscopy / pill Cam were negative. tTg normal. Reports cannot be seen   Previous GI History / Evaluation :   2010 Colonoscopy for FOBT+ ( Dr. Deatra Ina) -normal    Labs:   Recent Labs    05/19/22 1645  WBC 5.3  HGB 5.3*  HCT 18.9*  PLT 235   Recent Labs    05/19/22 1645 05/20/22 0758  NA 132* 135  K 3.6 3.2*  CL 100 103  CO2 20* 22  GLUCOSE 134* 191*  BUN 27* 20  CREATININE 1.40* 1.00  CALCIUM 9.1 8.9  No results for input(s): "PROT", "ALBUMIN", "AST", "ALT", "ALKPHOS", "BILITOT", "BILIDIR", "IBILI" in the last 72 hours. No results for input(s): "HEPBSAG", "HCVAB", "HEPAIGM", "HEPBIGM" in the last 72 hours. No results for input(s): "LABPROT", "INR" in the last 72 hours.  Past Medical History:  Diagnosis Date   Diabetes mellitus without complication ([place])    Distal radius fracture, left    GERD (gastroesophageal reflux disease)    History of hiatal hernia    Hypertension     Past Surgical History:  Procedure Laterality Date   HERNIA REPAIR     OPEN REDUCTION INTERNAL FIXATION (ORIF) DISTAL RADIAL FRACTURE Left 08/03/2015   Procedure: OPEN TREATMENT OF LEFT DISTAL RADIUS FRACTURE;  Surgeon: Milly Jakob, MD;  Location: [place];  Service: Orthopedics;   Laterality: Left;  GENERAL ANESTHESIA WITH PRE-OP BLOCK   SHOULDER ARTHROSCOPY Right     No family history on file.  Prior to Admission medications   Medication Sig Start Date End Date Taking? Authorizing Provider  acetaminophen (TYLENOL) 500 MG tablet Take 2 tablets (1,000 mg total) by mouth every 6 (six) hours as needed for mild pain. 12/15/17  Yes Barkley Boards R, PA-C  amLODipine (NORVASC) 5 MG tablet Take 5 mg by mouth daily.   Yes [provider]  aspirin EC 81 MG tablet Take 81 mg by mouth daily.   Yes [provider]  clonazePAM (KLONOPIN) 1 MG tablet Take 1 mg by mouth as needed for anxiety. 07/23/15  Yes [provider]  Cyanocobalamin (B-12 PO) Take 1 tablet by mouth at bedtime.   Yes [provider]  empagliflozin (JARDIANCE) 25 MG TABS tablet Take 25 mg by mouth daily.   Yes [provider]  glipiZIDE (GLUCOTROL) 10 MG tablet Take 10 mg by mouth 2 (two) times daily before a meal.   Yes [provider]  metFORMIN (GLUCOPHAGE-XR) 500 MG 24 hr tablet Take 1,000 mg by mouth 2 (two) times daily.   Yes [provider]  omeprazole (PRILOSEC) 20 MG capsule Take 20 mg by mouth daily. 06/09/15  Yes [provider]  Pyridoxine HCl (B-6 PO) Take 1 tablet by mouth at bedtime.   Yes [provider]  rosuvastatin (CRESTOR) 20 MG tablet Take 20 mg by mouth daily.   Yes [provider]  gabapentin (NEURONTIN) 100 MG capsule Take 3 capsules (300 mg total) by mouth 3 (three) times daily. Patient not taking: Reported on 05/20/2022 12/15/17   Norm Parcel, PA-C  methocarbamol (ROBAXIN) 500 MG tablet Take 2 tablets (1,000 mg total) by mouth every 8 (eight) hours as needed for muscle spasms. Patient not taking: Reported on 05/20/2022 12/15/17   Norm Parcel, PA-C  oxyCODONE (OXY IR/ROXICODONE) 5 MG immediate release tablet Take 1-2 tablets (5-10 mg total) by mouth every 4 (four) hours as needed for moderate  pain. Patient not taking: Reported on 05/20/2022 12/15/17   Norm Parcel, PA-C    Current Facility-Administered Medications  Medication Dose Route Frequency Provider Last Rate Last Admin   clonazePAM Bobbye [place]) tablet 0.5 mg  0.5 mg Oral QHS Tu, Ching T, DO   0.5 mg at 05/19/22 2156   cyanocobalamin (VITAMIN B12) tablet 500 mcg  500 mcg Oral QHS Tu, Ching T, DO       dextrose 5 % solution   Intravenous Continuous Georgette Shell, MD 75 mL/hr at 05/20/22 0856 New Bag at 05/20/22 0856   insulin aspart (novoLOG) injection 0-9 Units  0-9 Units Subcutaneous  TID WC Tu, Ching T, DO       iron sucrose (VENOFER) 300 mg in sodium chloride 0.9 % 250 mL IVPB  300 mg Intravenous Once Georgette Shell, MD       pantoprazole (PROTONIX) injection 40 mg  40 mg Intravenous Q12H Kommor, Madison, MD   40 mg at 05/20/22 0855   potassium chloride 10 mEq in 100 mL IVPB  10 mEq Intravenous Q1 Hr x 4 Georgette Shell, MD       pyridOXINE (VITAMIN B6) tablet 100 mg  100 mg Oral QHS Tu, Ching T, DO       rosuvastatin (CRESTOR) tablet 20 mg  20 mg Oral Daily Tu, Ching T, DO        Allergies as of 05/19/2022 - Review Complete 05/19/2022  Allergen Reaction Noted   Lisinopril Swelling 08/01/2008   Penicillins Other (See Comments) 09/23/2008   Sertraline Diarrhea 06/04/2010   Shellfish allergy Nausea And Vomiting 12/13/2017    Social History   Socioeconomic History   Marital status: Married    Spouse name: Not on file   Number of children: Not on file   Years of education: Not on file   Highest education level: Not on file  Occupational History   Not on file  Tobacco Use   Smoking status: Every Day    Packs/day: 1    Types: Cigarettes   Smokeless tobacco: Not on file  Substance and Sexual Activity   Alcohol use: No   Drug use: No   Sexual activity: Not on file  Other Topics Concern   Not on file  Social History Narrative   Not on file   Social Determinants of Health   Financial  Resource Strain: Not on file  Food Insecurity: No Food Insecurity (05/19/2022)   Hunger Vital Sign    Worried About Running Out of Food in the Last Year: Never true    Ran Out of Food in the Last Year: Never true  Transportation Needs: No Transportation Needs (05/19/2022)   PRAPARE - Hydrologist (Medical): No    Lack of Transportation (Non-Medical): No  Physical Activity: Not on file  Stress: Not on file  Social Connections: Not on file  Intimate Partner Violence: Not At Risk (05/19/2022)   Humiliation, Afraid, Rape, and Kick questionnaire    Fear of Current or Ex-Partner: No    Emotionally Abused: No    Physically Abused: No    Sexually Abused: No    Review of Systems: All systems reviewed and negative except where noted in HPI.  Physical Exam: Vital signs in last 24 hours: Temp:  [97.8 F (36.6 C)-98.6 F (37 C)] 97.8 F (36.6 C) (03/22 0736) Pulse Rate:  [64-89] 67 (03/22 0736) Resp:  [12-18] 18 (03/22 0736) BP: (131-159)/(48-62) 139/58 (03/22 0736) SpO2:  [97 %-100 %] 97 % (03/22 0736) Weight:  [65.8 kg] 65.8 kg (03/21 1629) Last BM Date : 05/18/22  General:  Alert male in NAD Psych:  Pleasant, cooperative. Normal mood and affect Eyes: Pupils equal Ears:  Normal auditory acuity Nose: No deformity, discharge or lesions Neck:  Supple, no masses felt Lungs:  Clear to auscultation.  Heart:  Regular rate, regular rhythm.  Abdomen:  Soft, nondistended, nontender, active bowel sounds, no masses felt Rectal :  Deferred Msk: Symmetrical without gross deformities.  Neurologic:  Alert, oriented, grossly normal neurologically Extremities : No edema Skin:  Intact without significant lesions.    Intake/Output  from previous day: 03/21 0701 - 03/22 0700 In: 1122.5 [P.O.:480; Blood:642.5] Out: 1300 [Urine:1300] Intake/Output this shift:  No intake/output data recorded.    Principal Problem:   Acute anemia Active Problems:   Acute GI  bleeding   Uncontrolled type 2 diabetes mellitus with hyperglycemia, without long-term current use of insulin (HCC)   Essential hypertension   Hyponatremia    Tye Savoy, NP-C @  05/20/2022, 10:16 AM

## 2022-05-20 NOTE — Consult Note (Addendum)
Consultation Note   Referring Provider:  [facility] PCP: Patient, No Pcp Per Primary Gastroenterologist: Previously Dr. Deatra Ina but more recently the [facility]        Reason for consultation: iron deficiency anemia  Hospital Day: 2   Assessment   77 yo male with the following:  Recurrent , severe iron deficiency anemia / FOBT +.  Hemoglobin 5.3. No overt GI bleeding S/p 2u pRBCs. Follow up CBC pending Got dose of Ferrlecit  DM2   Plan  -K+ repletion in progress -Await post-transfusion hgb -Clear liquid diet -Probable EGD / colonoscopy tomorrow. The risks and benefits of EGD and colonoscopy with possible polypectomy / biopsies were discussed and the patient agrees to proceed.   ------------------------------------------------------------------------------------------  [place] [facility] Attending   I have taken an interval history, reviewed the chart and examined the patient. I agree with the Advanced Practitioner's note, impression and recommendations.    He has recurrent iron deficiency anemia and is heme +. This seem sto be chronic and recurrent with unrevealing w/u at [facility]. He says the took him off iron at some point.  Will reassess with EGD and colonoscopy and consider repeat capsule endoscopy if  they are negative.  The risks and benefits as well as alternatives of endoscopic procedure(s) have been discussed and reviewed. All questions answered. The patient agrees to proceed.  I appreciate the opportunity to care for you. Gatha Mayer, MD, [facility]  History of Present Illness   Patient is a 77 y.o. year old male with a past medical history of HTN, DM, IBS, GERD, iron deficiency anemia     See PMH for any additional medical problems.  Kendarrius has a remote history of iron deficiency anemia requring iron infusions through the [facility]. He hasn't needed oral or IV iron in years   Admitted with progressive weakness and anemia  ( labs with PCP). In ED labs notable for hgb of 5.3, MCV 61, K+ 3.2, Ferritin of 3. FOBT+  Admitting H+P mentions that patient has been taking Diclofenac for back pain. He adamantly denies taking Diclofenac for at least two years. Also admission note mentions had he had an EGD / colonoscopy and Pill camera in 2020 but patient is adamant that these were done more in the distant past. Apparently anemia hasn't been an issue for him in many years. Romance has had loose stool for at least a year but stools haven't contained any blood and no black stools. He doesn't donate blood. No blood in urine. No epistaxis.  His weight has been stable over the last few months.    Per Care Everywhere:  Jan 2020 - EGD/ colonoscopy / pill Cam were negative. tTg normal. Reports cannot be seen   Previous GI History / Evaluation :   2010 Colonoscopy for FOBT+ ( Dr. Deatra Ina) -normal    Labs:   Recent Labs    05/19/22 1645  WBC 5.3  HGB 5.3*  HCT 18.9*  PLT 235   Recent Labs    05/19/22 1645 05/20/22 0758  NA 132* 135  K 3.6 3.2*  CL 100 103  CO2 20* 22  GLUCOSE 134* 191*  BUN 27* 20  CREATININE 1.40* 1.00  CALCIUM 9.1 8.9  No results for input(s): "PROT", "ALBUMIN", "AST", "ALT", "ALKPHOS", "BILITOT", "BILIDIR", "IBILI" in the last 72 hours. No results for input(s): "HEPBSAG", "HCVAB", "HEPAIGM", "HEPBIGM" in the last 72 hours. No results for input(s): "LABPROT", "INR" in the last 72 hours.  Past Medical History:  Diagnosis Date   Diabetes mellitus without complication ([place])    Distal radius fracture, left    GERD (gastroesophageal reflux disease)    History of hiatal hernia    Hypertension     Past Surgical History:  Procedure Laterality Date   HERNIA REPAIR     OPEN REDUCTION INTERNAL FIXATION (ORIF) DISTAL RADIAL FRACTURE Left 08/03/2015   Procedure: OPEN TREATMENT OF LEFT DISTAL RADIUS FRACTURE;  Surgeon: Milly Jakob, MD;  Location: [place];  Service: Orthopedics;   Laterality: Left;  GENERAL ANESTHESIA WITH PRE-OP BLOCK   SHOULDER ARTHROSCOPY Right     No family history on file.  Prior to Admission medications   Medication Sig Start Date End Date Taking? Authorizing Provider  acetaminophen (TYLENOL) 500 MG tablet Take 2 tablets (1,000 mg total) by mouth every 6 (six) hours as needed for mild pain. 12/15/17  Yes Barkley Boards R, PA-C  amLODipine (NORVASC) 5 MG tablet Take 5 mg by mouth daily.   Yes [provider]  aspirin EC 81 MG tablet Take 81 mg by mouth daily.   Yes [provider]  clonazePAM (KLONOPIN) 1 MG tablet Take 1 mg by mouth as needed for anxiety. 07/23/15  Yes [provider]  Cyanocobalamin (B-12 PO) Take 1 tablet by mouth at bedtime.   Yes [provider]  empagliflozin (JARDIANCE) 25 MG TABS tablet Take 25 mg by mouth daily.   Yes [provider]  glipiZIDE (GLUCOTROL) 10 MG tablet Take 10 mg by mouth 2 (two) times daily before a meal.   Yes [provider]  metFORMIN (GLUCOPHAGE-XR) 500 MG 24 hr tablet Take 1,000 mg by mouth 2 (two) times daily.   Yes [provider]  omeprazole (PRILOSEC) 20 MG capsule Take 20 mg by mouth daily. 06/09/15  Yes [provider]  Pyridoxine HCl (B-6 PO) Take 1 tablet by mouth at bedtime.   Yes [provider]  rosuvastatin (CRESTOR) 20 MG tablet Take 20 mg by mouth daily.   Yes [provider]  gabapentin (NEURONTIN) 100 MG capsule Take 3 capsules (300 mg total) by mouth 3 (three) times daily. Patient not taking: Reported on 05/20/2022 12/15/17   Norm Parcel, PA-C  methocarbamol (ROBAXIN) 500 MG tablet Take 2 tablets (1,000 mg total) by mouth every 8 (eight) hours as needed for muscle spasms. Patient not taking: Reported on 05/20/2022 12/15/17   Norm Parcel, PA-C  oxyCODONE (OXY IR/ROXICODONE) 5 MG immediate release tablet Take 1-2 tablets (5-10 mg total) by mouth every 4 (four) hours as needed for moderate  pain. Patient not taking: Reported on 05/20/2022 12/15/17   Norm Parcel, PA-C    Current Facility-Administered Medications  Medication Dose Route Frequency Provider Last Rate Last Admin   clonazePAM Bobbye [place]) tablet 0.5 mg  0.5 mg Oral QHS Tu, Ching T, DO   0.5 mg at 05/19/22 2156   cyanocobalamin (VITAMIN B12) tablet 500 mcg  500 mcg Oral QHS Tu, Ching T, DO       dextrose 5 % solution   Intravenous Continuous Georgette Shell, MD 75 mL/hr at 05/20/22 0856 New Bag at 05/20/22 0856   insulin aspart (novoLOG) injection 0-9 Units  0-9 Units Subcutaneous  TID WC Tu, Ching T, DO       iron sucrose (VENOFER) 300 mg in sodium chloride 0.9 % 250 mL IVPB  300 mg Intravenous Once Georgette Shell, MD       pantoprazole (PROTONIX) injection 40 mg  40 mg Intravenous Q12H Kommor, [place], MD   40 mg at 05/20/22 0855   potassium chloride 10 mEq in 100 mL IVPB  10 mEq Intravenous Q1 Hr x 4 Georgette Shell, MD       pyridOXINE (VITAMIN B6) tablet 100 mg  100 mg Oral QHS Tu, Ching T, DO       rosuvastatin (CRESTOR) tablet 20 mg  20 mg Oral Daily Tu, Ching T, DO        Allergies as of 05/19/2022 - Review Complete 05/19/2022  Allergen Reaction Noted   Lisinopril Swelling 08/01/2008   Penicillins Other (See Comments) 09/23/2008   Sertraline Diarrhea 06/04/2010   Shellfish allergy Nausea And Vomiting 12/13/2017    Social History   Socioeconomic History   Marital status: Married    Spouse name: Not on file   Number of children: Not on file   Years of education: Not on file   Highest education level: Not on file  Occupational History   Not on file  Tobacco Use   Smoking status: Every Day    Packs/day: 1    Types: Cigarettes   Smokeless tobacco: Not on file  Substance and Sexual Activity   Alcohol use: No   Drug use: No   Sexual activity: Not on file  Other Topics Concern   Not on file  Social History Narrative   Not on file   Social Determinants of Health   Financial  Resource Strain: Not on file  Food Insecurity: No Food Insecurity (05/19/2022)   Hunger Vital Sign    Worried About Running Out of Food in the Last Year: Never true    Ran Out of Food in the Last Year: Never true  Transportation Needs: No Transportation Needs (05/19/2022)   PRAPARE - Hydrologist (Medical): No    Lack of Transportation (Non-Medical): No  Physical Activity: Not on file  Stress: Not on file  Social Connections: Not on file  Intimate Partner Violence: Not At Risk (05/19/2022)   Humiliation, Afraid, Rape, and Kick questionnaire    Fear of Current or Ex-Partner: No    Emotionally Abused: No    Physically Abused: No    Sexually Abused: No    Review of Systems: All systems reviewed and negative except where noted in HPI.  Physical Exam: Vital signs in last 24 hours: Temp:  [97.8 F (36.6 C)-98.6 F (37 C)] 97.8 F (36.6 C) (03/22 0736) Pulse Rate:  [64-89] 67 (03/22 0736) Resp:  [12-18] 18 (03/22 0736) BP: (131-159)/(48-62) 139/58 (03/22 0736) SpO2:  [97 %-100 %] 97 % (03/22 0736) Weight:  [65.8 kg] 65.8 kg (03/21 1629) Last BM Date : 05/18/22  General:  Alert male in NAD Psych:  Pleasant, cooperative. Normal mood and affect Eyes: Pupils equal Ears:  Normal auditory acuity Nose: No deformity, discharge or lesions Neck:  Supple, no masses felt Lungs:  Clear to auscultation.  Heart:  Regular rate, regular rhythm.  Abdomen:  Soft, nondistended, nontender, active bowel sounds, no masses felt Rectal :  Deferred Msk: Symmetrical without gross deformities.  Neurologic:  Alert, oriented, grossly normal neurologically Extremities : No edema Skin:  Intact without significant lesions.    Intake/Output  from previous day: 03/21 0701 - 03/22 0700 In: 1122.5 [P.O.:480; Blood:642.5] Out: 1300 [Urine:1300] Intake/Output this shift:  No intake/output data recorded.    Principal Problem:   Acute anemia Active Problems:   Acute GI  bleeding   Uncontrolled type 2 diabetes mellitus with hyperglycemia, without long-term current use of insulin (HCC)   Essential hypertension   Hyponatremia    Tye Savoy, NP-C @  05/20/2022, 10:16 AM

## 2022-05-20 NOTE — Anesthesia Preprocedure Evaluation (Signed)
Anesthesia Evaluation  Patient identified by MRN, date of birth, ID band Patient awake    Reviewed: Allergy & Precautions, NPO status , Patient's Chart, lab work & pertinent test results  Airway Mallampati: III  TM Distance: >3 FB Neck ROM: Full    Dental  (+) Missing, Dental Advisory Given, Poor Dentition,    Pulmonary Current Smoker 1/2ppd, no inhalers    + decreased breath sounds      Cardiovascular hypertension (144/60 preop), Pt. on medications Normal cardiovascular exam Rhythm:Regular Rate:Normal     Neuro/Psych negative neurological ROS  negative psych ROS   GI/Hepatic hiatal hernia,GERD  Controlled,,GIB: presents after routine blood work outpatient revealed severe acute anemia- Hb 5.3   Endo/Other  diabetes, Well Controlled, Type 2, Oral Hypoglycemic Agents    Renal/GU   negative genitourinary   Musculoskeletal negative musculoskeletal ROS (+)    Abdominal   Peds  Hematology  (+) Blood dyscrasia, anemia Hb 7.8 this morning   Anesthesia Other Findings   Reproductive/Obstetrics negative OB ROS                             Anesthesia Physical Anesthesia Plan  ASA: 3  Anesthesia Plan: MAC   Post-op Pain Management:    Induction:   PONV Risk Score and Plan: 2 and Propofol infusion and TIVA  Airway Management Planned: Natural Airway and Simple Face Mask  Additional Equipment: None  Intra-op Plan:   Post-operative Plan:   Informed Consent: I have reviewed the patients History and Physical, chart, labs and discussed the procedure including the risks, benefits and alternatives for the proposed anesthesia with the patient or authorized representative who has indicated his/her understanding and acceptance.       Plan Discussed with: CRNA  Anesthesia Plan Comments:         Anesthesia Quick Evaluation

## 2022-05-21 ENCOUNTER — Encounter (HOSPITAL_COMMUNITY)
Admission: EM | Disposition: A | Discharge status: Home / Self Care | Payer: Self-pay | Source: Home / Self Care | Attending: Internal Medicine

## 2022-05-21 ENCOUNTER — Encounter (HOSPITAL_COMMUNITY): Payer: Self-pay | Admitting: Family Medicine

## 2022-05-21 ENCOUNTER — Inpatient Hospital Stay (HOSPITAL_COMMUNITY): Payer: PPO | Admitting: Certified Registered Nurse Anesthetist

## 2022-05-21 DIAGNOSIS — E119 Type 2 diabetes mellitus without complications: Secondary | ICD-10-CM

## 2022-05-21 DIAGNOSIS — I10 Essential (primary) hypertension: Secondary | ICD-10-CM | POA: Diagnosis not present

## 2022-05-21 DIAGNOSIS — F17210 Nicotine dependence, cigarettes, uncomplicated: Secondary | ICD-10-CM

## 2022-05-21 DIAGNOSIS — D509 Iron deficiency anemia, unspecified: Secondary | ICD-10-CM

## 2022-05-21 DIAGNOSIS — D649 Anemia, unspecified: Secondary | ICD-10-CM | POA: Diagnosis not present

## 2022-05-21 DIAGNOSIS — Z7984 Long term (current) use of oral hypoglycemic drugs: Secondary | ICD-10-CM

## 2022-05-21 HISTORY — PX: COLONOSCOPY WITH PROPOFOL: SHX5780

## 2022-05-21 HISTORY — PX: ESOPHAGOGASTRODUODENOSCOPY (EGD) WITH PROPOFOL: SHX5813

## 2022-05-21 LAB — COMPREHENSIVE METABOLIC PANEL
ALT: 13 U/L (ref 0–44)
AST: 16 U/L (ref 15–41)
Albumin: 3.9 g/dL (ref 3.5–5.0)
Alkaline Phosphatase: 38 U/L (ref 38–126)
Anion gap: 10 (ref 5–15)
BUN: 13 mg/dL (ref 8–23)
CO2: 19 mmol/L — ABNORMAL LOW (ref 22–32)
Calcium: 9.1 mg/dL (ref 8.9–10.3)
Chloride: 109 mmol/L (ref 98–111)
Creatinine, Ser: 0.84 mg/dL (ref 0.61–1.24)
GFR, Estimated: >60 mL/min (ref >60)
Glucose, Bld: 122 mg/dL — ABNORMAL HIGH (ref 70–99)
Potassium: 3.8 mmol/L (ref 3.5–5.1)
Sodium: 138 mmol/L (ref 135–145)
Total Bilirubin: 0.9 mg/dL (ref 0.3–1.2)
Total Protein: 7 g/dL (ref 6.5–8.1)

## 2022-05-21 LAB — BPAM RBC
Blood Product Expiration Date: 202404182359
Blood Product Expiration Date: 202404182359
ISSUE DATE / TIME: 202403220010
ISSUE DATE / TIME: 202403220240
Unit Type and Rh: 6200
Unit Type and Rh: 6200

## 2022-05-21 LAB — TYPE AND SCREEN
ABO/RH(D): A POS
Antibody Screen: NEGATIVE
Unit division: 0
Unit division: 0

## 2022-05-21 LAB — CBC
HCT: 27.8 % — ABNORMAL LOW (ref 39.0–52.0)
Hemoglobin: 7.8 g/dL — ABNORMAL LOW (ref 13.0–17.0)
MCH: 19.9 pg — ABNORMAL LOW (ref 26.0–34.0)
MCHC: 28.1 g/dL — ABNORMAL LOW (ref 30.0–36.0)
MCV: 71.1 fL — ABNORMAL LOW (ref 80.0–100.0)
Platelets: 240 10*3/uL (ref 150–400)
RBC: 3.91 MIL/uL — ABNORMAL LOW (ref 4.22–5.81)
RDW: 27.1 % — ABNORMAL HIGH (ref 11.5–15.5)
WBC: 5.4 10*3/uL (ref 4.0–10.5)
nRBC: 0.4 % — ABNORMAL HIGH (ref 0.0–0.2)

## 2022-05-21 LAB — GLUCOSE, CAPILLARY
Glucose-Capillary: 118 mg/dL — ABNORMAL HIGH (ref 70–99)
Glucose-Capillary: 126 mg/dL — ABNORMAL HIGH (ref 70–99)

## 2022-05-21 SURGERY — COLONOSCOPY WITH PROPOFOL
Anesthesia: Monitor Anesthesia Care

## 2022-05-21 MED ORDER — LACTATED RINGERS IV SOLN
INTRAVENOUS | Status: AC | PRN
  Administered 2022-05-21: 1000 mL via INTRAVENOUS

## 2022-05-21 MED ORDER — PROPOFOL 500 MG/50ML IV EMUL
INTRAVENOUS | Status: DC | PRN
  Administered 2022-05-21: 150 ug/kg/min via INTRAVENOUS

## 2022-05-21 MED ORDER — VITAMIN C 250 MG PO TABS: 500.0000 mg | ORAL_TABLET | Freq: Every day | ORAL | 4 refills | Status: AC

## 2022-05-21 MED ORDER — IRON (FERROUS SULFATE) 325 (65 FE) MG PO TABS: 325.0000 mg | ORAL_TABLET | Freq: Two times a day (BID) | ORAL | 4 refills | Status: AC

## 2022-05-21 MED ORDER — GLIPIZIDE 10 MG PO TABS: 5.0000 mg | ORAL_TABLET | Freq: Two times a day (BID) | ORAL | 2 refills | Status: DC

## 2022-05-21 SURGICAL SUPPLY — 25 items

## 2022-05-21 NOTE — Interval H&P Note (Signed)
History and Physical Interval Note:  05/21/2022 9:41 AM  Jacob Macdonald  has presented today for surgery, with the diagnosis of iron deficiency anemia, hemoccult positive stool.  The various methods of treatment have been discussed with the patient and family. After consideration of risks, benefits and other options for treatment, the patient has consented to  Procedure(s): COLONOSCOPY WITH PROPOFOL (N/A) ESOPHAGOGASTRODUODENOSCOPY (EGD) WITH PROPOFOL (N/A) as a surgical intervention.  The patient's history has been reviewed, patient examined, no change in status, stable for surgery.  I have reviewed the patient's chart and labs.  Questions were answered to the patient's satisfaction.     Silvano Rusk

## 2022-05-21 NOTE — Discharge Summary (Signed)
Physician Discharge Summary  Jacob Macdonald [id number] DOB: 07-05-1945 DOA: 05/19/2022  PCP: Patient, No Pcp Per  Admit date: 05/19/2022 Discharge date: 05/21/2022  Admitted From: home Disposition:  home Recommendations for Outpatient Follow-up:  Follow up with PCP in 1-2 weeks Please obtain BMP/CBC in one week  Home Health:none Equipment/Devices:none  Discharge Condition:stable  CODE STATUS: full Diet recommendation: cardiac Brief/Interim Summary:  77 yo male  with medical history significant of HTN, T2DM, iron deficiency anemia, IBS, GERD, HLD who presents after routine blood work outpatient revealed severe acute anemia.Has noticed months of progressive weakness and sob and dizziness.Denies dark stool. Not on iron supplementation but reports that [facility]. Has been taking diclofenac for past several months for back pain. Also on daily aspirin with last dose today. No alcohol use.  Pt receives most of his care at [facility]  He was documented to have GI bleed in 2014 and 2020.  In 2014 he was evaluated at [facility] and had EGD,  colonoscopy and PillCam that was negative. Had workup in 2020 with EGD and colonoscopy that was also within normal limits.  In the ED, temp of 98.74F, BP of 155/51,HR 70S on room air.  Hgb of 5.3, MCV of 61, Plt wnl at 235. FOBT is positive.  Mild hyponatremia at 132, K of 3.6, Cr elevated at 1.40 with BUN of 27 which based on previous labs may be around his baseline. He was given IV PPI and 2u pRBC was ordered to be transfused in ED.  Discharge Diagnoses:  Principal Problem:   Acute anemia Active Problems:   Acute GI bleeding   Uncontrolled type 2 diabetes mellitus with hyperglycemia, without long-term current use of insulin (HCC)   Essential hypertension   Hyponatremia   Iron deficiency anemia    #1  Acute anemia -patient presented with generalized weakness dizziness and shortness of  breath.  Admission hemoglobin was 5.3 he got 2 units of blood transfusion with improvement in his hemoglobin was 7.8 on the day of discharge.  Patient continued to have brown stools during the hospital stay did not have any black stools.  However his FOBT was positive.  So he had an EGD and a colonoscopy by Dr. Carlean Purl which was essentially negative other than findings slightly edematous hemorrhoids.  Examined portion of the esophagus, stomach, duodenum, leum and colon was normal.  There was no cause of iron deficiency anemia and was found.  He was advised to start taking iron sulfate twice a day with vitamin C which has been prescribed at the time of discharge.  He received 2 units of Venofer during this hospital stay.     #2 history of essential hypertension-continue Norvasc 5 mg daily.  #3 type 2 diabetes-continue SSI-continue Jardiance glipizide and Glucophage.  The dose of glipizide was decreased to 5 mg twice daily from 10 mg twice daily as his blood sugar was on the low side during the hospital stay.   #4 hyperlipidemia on Crestor 20 mg daily.   #5 chronic pain on oxycodone and gabapentin and Robaxin at home on hold   #6 anxiety on Klonopin   #7 hypokalemia resolved.   Estimated body mass index is 21.42 kg/m as calculated from the following:   Height as of this encounter: 5\' 9"  (1.753 m).   Weight as of this encounter: 65.8 kg.  Discharge Instructions  Discharge Instructions     Diet - low sodium heart  healthy   Complete by: As directed    Increase activity slowly   Complete by: As directed       Allergies as of 05/21/2022       Reactions   Lisinopril Swelling   REACTION: swelling Other Reaction(s): Angioedema, Unknown   Penicillins Other (See Comments)   REACTION: swelling Has patient had a PCN reaction causing immediate rash, facial/tongue/throat swelling, SOB or lightheadedness with hypotension: unknown Has patient had a PCN reaction causing severe rash involving mucus  membranes or skin necrosis: unknown Has patient had a PCN reaction that required hospitalization unknown Has patient had a PCN reaction occurring within the last 10 years: unknown If all of the above answers are "NO", then may proceed with Cephalosporin use.   Sertraline Diarrhea   Other Reaction(s): Unknown   Shellfish Allergy Nausea And Vomiting           Medication List     STOP taking these medications    gabapentin 100 MG capsule Commonly known as: NEURONTIN   methocarbamol 500 MG tablet Commonly known as: ROBAXIN   oxyCODONE 5 MG immediate release tablet Commonly known as: Oxy IR/ROXICODONE       TAKE these medications    acetaminophen 500 MG tablet Commonly known as: TYLENOL Take 2 tablets (1,000 mg total) by mouth every 6 (six) hours as needed for mild pain.   amLODipine 5 MG tablet Commonly known as: NORVASC Take 5 mg by mouth daily.   aspirin EC 81 MG tablet Take 81 mg by mouth daily.   B-12 PO Take 1 tablet by mouth at bedtime.   B-6 PO Take 1 tablet by mouth at bedtime.   clonazePAM 1 MG tablet Commonly known as: KLONOPIN Take 1 mg by mouth as needed for anxiety.   glipiZIDE 10 MG tablet Commonly known as: GLUCOTROL Take 0.5 tablets (5 mg total) by mouth 2 (two) times daily before a meal. What changed: how much to take Notes to patient: This Medication Glipizide 0.5 mg is to be taken 2 times per Day.   Iron (Ferrous Sulfate) 325 (65 Fe) MG Tabs Take 325 mg by mouth 2 (two) times daily.   Jardiance 25 MG Tabs tablet Generic drug: empagliflozin Take 25 mg by mouth daily.   metFORMIN 500 MG 24 hr tablet Commonly known as: GLUCOPHAGE-XR Take 1,000 mg by mouth 2 (two) times daily.   omeprazole 20 MG capsule Commonly known as: PRILOSEC Take 20 mg by mouth daily.   rosuvastatin 20 MG tablet Commonly known as: CRESTOR Take 20 mg by mouth daily.   vitamin C 250 MG tablet Commonly known as: ASCORBIC ACID Take 2 tablets (500 mg total) by  mouth daily.        Allergies  Allergen Reactions   Lisinopril Swelling    REACTION: swelling  Other Reaction(s): Angioedema, Unknown   Penicillins Other (See Comments)    REACTION: swelling Has patient had a PCN reaction causing immediate rash, facial/tongue/throat swelling, SOB or lightheadedness with hypotension: unknown Has patient had a PCN reaction causing severe rash involving mucus membranes or skin necrosis: unknown Has patient had a PCN reaction that required hospitalization unknown Has patient had a PCN reaction occurring within the last 10 years: unknown If all of the above answers are "NO", then may proceed with Cephalosporin use.    Sertraline Diarrhea    Other Reaction(s): Unknown   Shellfish Allergy Nausea And Vomiting         Consultations:gi gesner   Procedures/Studies:  No results found.    Subjective: Patient resting in bed family by the bedside no nausea vomiting he had bowel movements which is brown in color  Discharge Exam: Vitals:   05/21/22 1040 05/21/22 1116  BP: (!) 150/51 (!) 137/57  Pulse: 63 60  Resp:  17  Temp:  (!) 97.4 F (36.3 C)  SpO2: 98% 99%   Vitals:   05/21/22 1030 05/21/22 1035 05/21/22 1040 05/21/22 1116  BP: (!) 126/52  (!) 150/51 (!) 137/57  Pulse: 63 (!) 55 63 60  Resp: 14 11  17   Temp:    (!) 97.4 F (36.3 C)  TempSrc:    Oral  SpO2: 100% 98% 98% 99%  Weight:      Height:        General: Pt is alert, awake, not in acute distress Cardiovascular: RRR, S1/S2 +, no rubs, no gallops Respiratory: CTA bilaterally, no wheezing, no rhonchi Abdominal: Soft, NT, ND, bowel sounds + Extremities: no edema, no cyanosis    The results of significant diagnostics from this hospitalization (including imaging, microbiology, ancillary and laboratory) are listed below for reference.     Microbiology: No results found for this or any previous visit (from the past 240 hour(s)).   Labs: BNP (last 3 results) No results for  input(s): "BNP" in the last 8760 hours. Basic Metabolic Panel: Recent Labs  Lab 05/19/22 1645 05/20/22 0758 05/21/22 0655  NA 132* 135 138  K 3.6 3.2* 3.8  CL 100 103 109  CO2 20* 22 19*  GLUCOSE 134* 191* 122*  BUN 27* 20 13  CREATININE 1.40* 1.00 0.84  CALCIUM 9.1 8.9 9.1  MG  --  2.1  --    Liver Function Tests: Recent Labs  Lab 05/21/22 0655  AST 16  ALT 13  ALKPHOS 38  BILITOT 0.9  PROT 7.0  ALBUMIN 3.9   No results for input(s): "LIPASE", "AMYLASE" in the last 168 hours. No results for input(s): "AMMONIA" in the last 168 hours. CBC: Recent Labs  Lab 05/19/22 1645 05/20/22 0757 05/21/22 0655  WBC 5.3 3.9* 5.4  HGB 5.3* 7.4* 7.8*  HCT 18.9* 24.6* 27.8*  MCV 61.4* 67.6* 71.1*  PLT 235 224 240   Cardiac Enzymes: No results for input(s): "CKTOTAL", "CKMB", "CKMBINDEX", "TROPONINI" in the last 168 hours. BNP: Invalid input(s): "POCBNP" CBG: Recent Labs  Lab 05/20/22 1124 05/20/22 1652 05/20/22 2047 05/21/22 0747 05/21/22 1113  GLUCAP 108* 122* 157* 118* 126*   D-Dimer No results for input(s): "DDIMER" in the last 72 hours. Hgb A1c No results for input(s): "HGBA1C" in the last 72 hours. Lipid Profile No results for input(s): "CHOL", "HDL", "LDLCALC", "TRIG", "CHOLHDL", "LDLDIRECT" in the last 72 hours. Thyroid function studies No results for input(s): "TSH", "T4TOTAL", "T3FREE", "THYROIDAB" in the last 72 hours.  Invalid input(s): "FREET3" Anemia work up Recent Labs    05/19/22 1723  FERRITIN 3*  TIBC 447  IRON 13*   Urinalysis    Component Value Date/Time   COLORURINE YELLOW 05/19/2022 [place] 05/19/2022 1649   LABSPEC 1.010 05/19/2022 1649   PHURINE 5.0 05/19/2022 1649   GLUCOSEU >=500 (A) 05/19/2022 1649   HGBUR NEGATIVE 05/19/2022 1649   BILIRUBINUR NEGATIVE 05/19/2022 1649   KETONESUR 5 (A) 05/19/2022 1649   PROTEINUR NEGATIVE 05/19/2022 1649   NITRITE NEGATIVE 05/19/2022 1649   LEUKOCYTESUR NEGATIVE  05/19/2022 1649   Sepsis Labs Recent Labs  Lab 05/19/22 1645 05/20/22 0757 05/21/22 0655  WBC 5.3 3.9*  5.4   Microbiology No results found for this or any previous visit (from the past 240 hour(s)).   Time coordinating discharge: 39 minutes  SIGNED:   Georgette Shell, MD  [facility] 05/21/2022, 1:48 PM

## 2022-05-21 NOTE — Op Note (Signed)
[facility] Patient Name: Jacob Macdonald Procedure Date: 05/21/2022 MRN: [id number] Attending MD: Gatha Mayer , MD, [id number] Date of Birth: February 25, 1946 CSN: [id number] Age: 77 Admit Type: Inpatient Procedure:                Colonoscopy Indications:              Iron deficiency anemia Providers:                Gatha Mayer, MD, Doristine Johns, RN, Gloris Ham, Technician Referring MD:              Medicines:                Monitored Anesthesia Care Complications:            No immediate complications. Estimated Blood Loss:     Estimated blood loss: none. Procedure:                Pre-Anesthesia Assessment:                           - Prior to the procedure, a History and Physical                            was performed, and patient medications and                            allergies were reviewed. The patient's tolerance of                            previous anesthesia was also reviewed. The risks                            and benefits of the procedure and the sedation                            options and risks were discussed with the patient.                            All questions were answered, and informed consent                            was obtained. Prior Anticoagulants: The patient has                            taken no anticoagulant or antiplatelet agents. ASA                            Grade Assessment: III - A patient with severe                            systemic disease. After reviewing the risks and  benefits, the patient was deemed in satisfactory                            condition to undergo the procedure.                           - Prior to the procedure, a History and Physical                            was performed, and patient medications and                            allergies were reviewed. The patient's tolerance of                            previous anesthesia  was also reviewed. The risks                            and benefits of the procedure and the sedation                            options and risks were discussed with the patient.                            All questions were answered, and informed consent                            was obtained. Prior Anticoagulants: The patient has                            taken no anticoagulant or antiplatelet agents. ASA                            Grade Assessment: III - A patient with severe                            systemic disease. After reviewing the risks and                            benefits, the patient was deemed in satisfactory                            condition to undergo the procedure.                           After obtaining informed consent, the colonoscope                            was passed under direct vision. Throughout the                            procedure, the patient's blood pressure, pulse, and  oxygen saturations were monitored continuously. The                            [id number] Olympus colonoscope was                            introduced through the anus and advanced to the the                            terminal ileum, with identification of the                            appendiceal orifice and IC valve. The colonoscopy                            was performed without difficulty. The patient                            tolerated the procedure well. The quality of the                            bowel preparation was adequate. The terminal ileum,                            ileocecal valve, appendiceal orifice, and rectum                            were photographed. The bowel preparation used was                            MoviPrep via split dose instruction. Scope In: 9:55:46 AM Scope Out: 10:10:49 AM Scope Withdrawal Time: 0 hours 11 minutes 32 seconds  Total Procedure Duration: 0 hours 15 minutes 3 seconds  Findings:       The perianal and digital rectal examinations were normal. Pertinent       negatives include normal prostate (size, shape, and consistency).      The terminal ileum appeared normal.      The colon (entire examined portion) appeared normal.      No additional abnormalities were found on retroflexion. Impression:               - The examined portion of the ileum was normal.                           - The entire examined colon is normal. (did see                            slightly edematous hemorrhoids)                           - No specimens collected. NO CAUSE OF IRON                            DEFICIANCY ANEMIA SEEN - AS THIS IS A KNOWN ISSUE  AND HE WAS TAKEN OFF IRON > 1 YEAR AGO + HAD NEG                            [facility] NO [facility]URTHER EVAL Moderate Sedation:      Not Applicable - Patient had care per Anesthesia. Recommendation:           - Discharge patient to home. HE HAS GOTTEN IV IRON                            + PRBC'S AND HGB 7.8 - D/W SIFE STAY ON FERROUS                            SULFATE 325 NMG QD AND HAVE CBC AT LEAST 3X/YEAR                            ONCE HGB AND FERRITIN NL - F/U PCP DOES NOT NEED TO                            SEE GI AND NO NEED FOR ROUTINE COLONOSCOPY AT HIS                            AGE AND NO POLYPS                           MAY TAKE 20 MG OMEPRAZOLE DAILY AS HE WAS PTA Procedure Code(s):        --- Professional ---                           [phone], Colonoscopy, flexible; diagnostic, including                            collection of specimen(s) by brushing or washing,                            when performed (separate procedure) Diagnosis Code(s):        --- Professional ---                           D50.9, Iron deficiency anemia, unspecified CPT copyright 2022 American [employer] Association. All rights reserved. The codes documented in this report are preliminary and upon coder review may  be  revised to meet current compliance requirements. Gatha Mayer, MD 05/21/2022 10:30:11 AM This report has been signed electronically. Number of Addenda: 0

## 2022-05-21 NOTE — Transfer of Care (Signed)
Immediate Anesthesia Transfer of Care Note  Patient: Jacob Macdonald  Procedure(s) Performed: COLONOSCOPY WITH PROPOFOL ESOPHAGOGASTRODUODENOSCOPY (EGD) WITH PROPOFOL  Patient Location: PACU  Anesthesia Type:MAC  Level of Consciousness: sedated, patient cooperative, and responds to stimulation  Airway & Oxygen Therapy: Patient Spontanous Breathing and Patient connected to face mask oxygen  Post-op Assessment: Report given to RN and Post -op Vital signs reviewed and stable  Post vital signs: Reviewed and stable  Last Vitals:  Vitals Value Taken Time  BP 109/37 05/21/22 1015  Temp    Pulse 60 05/21/22 1016  Resp 9 05/21/22 1016  SpO2 100 % 05/21/22 1016  Vitals shown include unvalidated device data.  Last Pain:  Vitals:   05/21/22 0912  TempSrc:   PainSc: 0-No pain         Complications: No notable events documented.

## 2022-05-21 NOTE — Op Note (Signed)
[facility] Patient Name: Jacob Jacob Procedure Date: 05/21/2022 MRN: [id number] Attending MD: Gatha Mayer , MD, [phone] Date of Birth: [birth year] CSN: [id number] Age: 77 Admit Type: Inpatient Procedure:                Upper GI endoscopy Indications:              Iron deficiency anemia Providers:                Gatha Mayer, MD, Doristine Johns, RN, Gloris Ham, Technician Referring MD:              Medicines:                Monitored Anesthesia Care Complications:            No immediate complications. Estimated Blood Loss:     Estimated blood loss: none. Procedure:                Pre-Anesthesia Assessment:                           - Prior to the procedure, a History and Physical                            was performed, and patient medications and                            allergies were reviewed. The patient's tolerance of                            previous anesthesia was also reviewed. The risks                            and benefits of the procedure and the sedation                            options and risks were discussed with the patient.                            All questions were answered, and informed consent                            was obtained. Prior Anticoagulants: The patient has                            taken no anticoagulant or antiplatelet agents. ASA                            Grade Assessment: III - A patient with severe                            systemic disease. After reviewing the risks and  benefits, the patient was deemed in satisfactory                            condition to undergo the procedure.                           After obtaining informed consent, the endoscope was                            passed under direct vision. Throughout the                            procedure, the patient's blood pressure, pulse, and                            oxygen  saturations were monitored continuously. The                            [id number] Olympus endoscope was introduced                            through the mouth, and advanced to the second part                            of duodenum. The upper GI endoscopy was                            accomplished without difficulty. The patient                            tolerated the procedure well. Scope In: Scope Out: Findings:      The esophagus was normal.      The stomach was normal.      The examined duodenum was normal.      The cardia and gastric fundus were normal on retroflexion. Impression:               - Normal esophagus.                           - Normal stomach.                           - Normal examined duodenum.                           - No specimens collected. NO CAUSE OF IRON DEF                            ANEMIA Moderate Sedation:      Not Applicable - Patient had care per Anesthesia. Recommendation:           - See the other procedure note for documentation of                            additional recommendations. COLONOSCOPY NEXT Procedure Code(s):        ---  Professional ---                           [phone], Esophagogastroduodenoscopy, flexible,                            transoral; diagnostic, including collection of                            specimen(s) by brushing or washing, when performed                            (separate procedure) Diagnosis Code(s):        --- Professional ---                           D50.9, Iron deficiency anemia, unspecified CPT copyright 2022 American Medical Association. All rights reserved. The codes documented in this report are preliminary and upon coder review may  be revised to meet current compliance requirements. Gatha Mayer, MD 05/21/2022 10:25:36 AM This report has been signed electronically. Number of Addenda: 0

## 2022-05-21 NOTE — Anesthesia Postprocedure Evaluation (Signed)
Anesthesia Post Note  Patient: Jacob Macdonald  Procedure(s) Performed: COLONOSCOPY WITH PROPOFOL ESOPHAGOGASTRODUODENOSCOPY (EGD) WITH PROPOFOL     Patient location during evaluation: PACU Anesthesia Type: MAC Level of consciousness: awake and alert Pain management: pain level controlled Vital Signs Assessment: post-procedure vital signs reviewed and stable Respiratory status: spontaneous breathing, nonlabored ventilation and respiratory function stable Cardiovascular status: blood pressure returned to baseline and stable Postop Assessment: no apparent nausea or vomiting Anesthetic complications: no   No notable events documented.  Last Vitals:  Vitals:   05/21/22 1025 05/21/22 1030  BP: (!) 121/41 (!) 126/52  Pulse: (!) 55 63  Resp: 15 14  Temp:    SpO2: 100% 100%    Last Pain:  Vitals:   05/21/22 1025  TempSrc:   PainSc: 0-No pain                 Pervis Hocking

## 2022-05-21 NOTE — Progress Notes (Signed)
Patient to be discharged to home today. Patient and Patient's Wife given discharge instructions including all discharge Medications and schedules for these Medications. Understanding verbalized by both Patient and the patient's Wife. Discharge AVS with the Patient at time of discharge

## 2022-05-22 ENCOUNTER — Encounter (HOSPITAL_COMMUNITY): Payer: Self-pay | Admitting: Internal Medicine

## 2022-06-01 DIAGNOSIS — D500 Iron deficiency anemia secondary to blood loss (chronic): Secondary | ICD-10-CM | POA: Diagnosis not present

## 2022-06-08 DIAGNOSIS — Z0000 Encounter for general adult medical examination without abnormal findings: Secondary | ICD-10-CM | POA: Diagnosis not present

## 2022-06-08 DIAGNOSIS — Z09 Encounter for follow-up examination after completed treatment for conditions other than malignant neoplasm: Secondary | ICD-10-CM | POA: Diagnosis not present

## 2022-06-08 DIAGNOSIS — E782 Mixed hyperlipidemia: Secondary | ICD-10-CM | POA: Diagnosis not present

## 2022-06-08 DIAGNOSIS — D500 Iron deficiency anemia secondary to blood loss (chronic): Secondary | ICD-10-CM | POA: Diagnosis not present

## 2022-06-08 DIAGNOSIS — E1165 Type 2 diabetes mellitus with hyperglycemia: Secondary | ICD-10-CM | POA: Diagnosis not present

## 2022-06-08 DIAGNOSIS — I10 Essential (primary) hypertension: Secondary | ICD-10-CM | POA: Diagnosis not present

## 2022-09-15 DIAGNOSIS — E782 Mixed hyperlipidemia: Secondary | ICD-10-CM | POA: Diagnosis not present

## 2022-09-15 DIAGNOSIS — I10 Essential (primary) hypertension: Secondary | ICD-10-CM | POA: Diagnosis not present

## 2022-09-15 DIAGNOSIS — D500 Iron deficiency anemia secondary to blood loss (chronic): Secondary | ICD-10-CM | POA: Diagnosis not present

## 2022-09-15 DIAGNOSIS — E1165 Type 2 diabetes mellitus with hyperglycemia: Secondary | ICD-10-CM | POA: Diagnosis not present

## 2022-09-17 ENCOUNTER — Other Ambulatory Visit: Payer: Self-pay

## 2022-09-17 ENCOUNTER — Inpatient Hospital Stay (HOSPITAL_COMMUNITY)
Admission: EM | Admit: 2022-09-17 | Discharge: 2022-09-19 | DRG: 638 | Disposition: A | Discharge status: Home / Self Care | Payer: No Typology Code available for payment source | Attending: Family Medicine | Admitting: Family Medicine

## 2022-09-17 ENCOUNTER — Emergency Department (HOSPITAL_COMMUNITY): Payer: No Typology Code available for payment source

## 2022-09-17 DIAGNOSIS — Z7982 Long term (current) use of aspirin: Secondary | ICD-10-CM | POA: Diagnosis not present

## 2022-09-17 DIAGNOSIS — Z23 Encounter for immunization: Secondary | ICD-10-CM

## 2022-09-17 DIAGNOSIS — I10 Essential (primary) hypertension: Secondary | ICD-10-CM | POA: Diagnosis present

## 2022-09-17 DIAGNOSIS — N179 Acute kidney failure, unspecified: Secondary | ICD-10-CM | POA: Insufficient documentation

## 2022-09-17 DIAGNOSIS — Z7984 Long term (current) use of oral hypoglycemic drugs: Secondary | ICD-10-CM | POA: Diagnosis not present

## 2022-09-17 DIAGNOSIS — E876 Hypokalemia: Secondary | ICD-10-CM | POA: Diagnosis not present

## 2022-09-17 DIAGNOSIS — E860 Dehydration: Secondary | ICD-10-CM | POA: Diagnosis present

## 2022-09-17 DIAGNOSIS — R739 Hyperglycemia, unspecified: Principal | ICD-10-CM

## 2022-09-17 DIAGNOSIS — F17210 Nicotine dependence, cigarettes, uncomplicated: Secondary | ICD-10-CM | POA: Diagnosis not present

## 2022-09-17 DIAGNOSIS — D509 Iron deficiency anemia, unspecified: Secondary | ICD-10-CM | POA: Diagnosis not present

## 2022-09-17 DIAGNOSIS — K219 Gastro-esophageal reflux disease without esophagitis: Secondary | ICD-10-CM | POA: Diagnosis not present

## 2022-09-17 DIAGNOSIS — E785 Hyperlipidemia, unspecified: Secondary | ICD-10-CM | POA: Diagnosis present

## 2022-09-17 DIAGNOSIS — E1100 Type 2 diabetes mellitus with hyperosmolarity without nonketotic hyperglycemic-hyperosmolar coma (NKHHC): Secondary | ICD-10-CM | POA: Diagnosis present

## 2022-09-17 DIAGNOSIS — R059 Cough, unspecified: Secondary | ICD-10-CM | POA: Diagnosis not present

## 2022-09-17 DIAGNOSIS — E1165 Type 2 diabetes mellitus with hyperglycemia: Principal | ICD-10-CM | POA: Diagnosis present

## 2022-09-17 LAB — BASIC METABOLIC PANEL
Anion gap: 11 (ref 5–15)
Anion gap: 10 (ref 5–15)
BUN: 37 mg/dL — ABNORMAL HIGH (ref 8–23)
BUN: 30 mg/dL — ABNORMAL HIGH (ref 8–23)
CO2: 26 mmol/L (ref 22–32)
CO2: 28 mmol/L (ref 22–32)
Calcium: 8.7 mg/dL — ABNORMAL LOW (ref 8.9–10.3)
Calcium: 9.2 mg/dL (ref 8.9–10.3)
Chloride: 88 mmol/L — ABNORMAL LOW (ref 98–111)
Chloride: 100 mmol/L (ref 98–111)
Creatinine, Ser: 1.7 mg/dL — ABNORMAL HIGH (ref 0.61–1.24)
Creatinine, Ser: 1.32 mg/dL — ABNORMAL HIGH (ref 0.61–1.24)
GFR, Estimated: 42 mL/min — ABNORMAL LOW (ref >60)
GFR, Estimated: 56 mL/min — ABNORMAL LOW (ref >60)
Glucose, Bld: 742 mg/dL (ref 70–99)
Glucose, Bld: 126 mg/dL — ABNORMAL HIGH (ref 70–99)
Potassium: 3.3 mmol/L — ABNORMAL LOW (ref 3.5–5.1)
Potassium: 3.4 mmol/L — ABNORMAL LOW (ref 3.5–5.1)
Sodium: 125 mmol/L — ABNORMAL LOW (ref 135–145)
Sodium: 138 mmol/L (ref 135–145)

## 2022-09-17 LAB — MRSA NEXT GEN BY PCR, NASAL: MRSA by PCR Next Gen: NOT DETECTED

## 2022-09-17 LAB — BLOOD GAS, VENOUS
Acid-Base Excess: 4.5 mmol/L — ABNORMAL HIGH (ref 0.0–2.0)
Bicarbonate: 31.2 mmol/L — ABNORMAL HIGH (ref 20.0–28.0)
O2 Saturation: 46.4 %
Patient temperature: 37
pCO2, Ven: 54 mmHg (ref 44–60)
pH, Ven: 7.37 (ref 7.25–7.43)
pO2, Ven: <31 mmHg — CL (ref 32–45)

## 2022-09-17 LAB — URINALYSIS, ROUTINE W REFLEX MICROSCOPIC
Bilirubin Urine: NEGATIVE
Glucose, UA: >=500 mg/dL — AB
Ketones, ur: 5 mg/dL — AB
Leukocytes,Ua: NEGATIVE
Nitrite: NEGATIVE
Protein, ur: 30 mg/dL — AB
Specific Gravity, Urine: 1.024 (ref 1.005–1.030)
pH: 6 (ref 5.0–8.0)

## 2022-09-17 LAB — CBG MONITORING, ED
Glucose-Capillary: >600 mg/dL (ref 70–99)
Glucose-Capillary: 566 mg/dL (ref 70–99)
Glucose-Capillary: >600 mg/dL (ref 70–99)
Glucose-Capillary: 353 mg/dL — ABNORMAL HIGH (ref 70–99)
Glucose-Capillary: 309 mg/dL — ABNORMAL HIGH (ref 70–99)

## 2022-09-17 LAB — CBC
HCT: 42.3 % (ref 39.0–52.0)
Hemoglobin: 15.1 g/dL (ref 13.0–17.0)
MCH: 31.3 pg (ref 26.0–34.0)
MCHC: 35.7 g/dL (ref 30.0–36.0)
MCV: 87.6 fL (ref 80.0–100.0)
Platelets: 179 10*3/uL (ref 150–400)
RBC: 4.83 MIL/uL (ref 4.22–5.81)
RDW: 12.9 % (ref 11.5–15.5)
WBC: 5.3 10*3/uL (ref 4.0–10.5)
nRBC: 0 % (ref 0.0–0.2)

## 2022-09-17 LAB — GLUCOSE, CAPILLARY
Glucose-Capillary: 212 mg/dL — ABNORMAL HIGH (ref 70–99)
Glucose-Capillary: 141 mg/dL — ABNORMAL HIGH (ref 70–99)
Glucose-Capillary: 173 mg/dL — ABNORMAL HIGH (ref 70–99)
Glucose-Capillary: 185 mg/dL — ABNORMAL HIGH (ref 70–99)
Glucose-Capillary: 153 mg/dL — ABNORMAL HIGH (ref 70–99)

## 2022-09-17 LAB — BETA-HYDROXYBUTYRIC ACID
Beta-Hydroxybutyric Acid: 0.92 mmol/L — ABNORMAL HIGH (ref 0.05–0.27)
Beta-Hydroxybutyric Acid: 0.16 mmol/L (ref 0.05–0.27)

## 2022-09-17 LAB — OSMOLALITY: Osmolality: 321 mOsm/kg (ref 275–295)

## 2022-09-17 MED ORDER — ACETAMINOPHEN 650 MG RE SUPP: 650.0000 mg | Freq: Four times a day (QID) | RECTAL | Status: DC | PRN

## 2022-09-17 MED ORDER — CLONAZEPAM 1 MG PO TABS: 1.0000 mg | ORAL_TABLET | Freq: Every day | ORAL | Status: DC | PRN

## 2022-09-17 MED ORDER — DEXTROSE IN LACTATED RINGERS 5 % IV SOLN: INTRAVENOUS | Status: DC

## 2022-09-17 MED ORDER — ALBUTEROL SULFATE (2.5 MG/3ML) 0.083% IN NEBU: 2.5000 mg | INHALATION_SOLUTION | RESPIRATORY_TRACT | Status: DC | PRN

## 2022-09-17 MED ORDER — TRAZODONE HCL 50 MG PO TABS: 25.0000 mg | ORAL_TABLET | Freq: Every evening | ORAL | Status: DC | PRN

## 2022-09-17 MED ORDER — AMLODIPINE BESYLATE 5 MG PO TABS
5.0000 mg | ORAL_TABLET | Freq: Every day | ORAL | Status: DC
  Administered 2022-09-18 – 2022-09-19 (×2): 5 mg via ORAL
  Filled 2022-09-17 (×3): qty 1

## 2022-09-17 MED ORDER — POTASSIUM CHLORIDE 10 MEQ/100ML IV SOLN
10.0000 meq | INTRAVENOUS | Status: AC
  Administered 2022-09-17 (×4): 10 meq via INTRAVENOUS
  Filled 2022-09-17 (×4): qty 100

## 2022-09-17 MED ORDER — FERROUS SULFATE 325 (65 FE) MG PO TABS
325.0000 mg | ORAL_TABLET | Freq: Two times a day (BID) | ORAL | Status: DC
  Administered 2022-09-18 – 2022-09-19 (×3): 325 mg via ORAL
  Filled 2022-09-17 (×4): qty 1

## 2022-09-17 MED ORDER — LACTATED RINGERS IV SOLN: INTRAVENOUS | Status: DC

## 2022-09-17 MED ORDER — DEXTROSE 50 % IV SOLN: 0.0000 mL | INTRAVENOUS | Status: DC | PRN

## 2022-09-17 MED ORDER — ONDANSETRON HCL 4 MG/2ML IJ SOLN: 4.0000 mg | Freq: Four times a day (QID) | INTRAMUSCULAR | Status: DC | PRN

## 2022-09-17 MED ORDER — ONDANSETRON HCL 4 MG PO TABS: 4.0000 mg | ORAL_TABLET | Freq: Four times a day (QID) | ORAL | Status: DC | PRN

## 2022-09-17 MED ORDER — CLONAZEPAM 0.5 MG PO TABS: 1.0000 mg | ORAL_TABLET | Freq: Every day | ORAL | Status: DC | PRN

## 2022-09-17 MED ORDER — INSULIN GLARGINE-YFGN 100 UNIT/ML ~~LOC~~ SOLN
12.0000 [IU] | Freq: Two times a day (BID) | SUBCUTANEOUS | Status: DC
  Administered 2022-09-17 – 2022-09-19 (×4): 12 [IU] via SUBCUTANEOUS
  Filled 2022-09-17 (×5): qty 0.12

## 2022-09-17 MED ORDER — INSULIN REGULAR(HUMAN) IN NACL 100-0.9 UT/100ML-% IV SOLN
INTRAVENOUS | Status: DC
  Administered 2022-09-17: 9.5 [IU]/h via INTRAVENOUS
  Filled 2022-09-17: qty 100

## 2022-09-17 MED ORDER — LACTATED RINGERS IV BOLUS
20.0000 mL/kg | Freq: Once | INTRAVENOUS | Status: AC
  Administered 2022-09-17: 1252 mL via INTRAVENOUS

## 2022-09-17 MED ORDER — INSULIN ASPART 100 UNIT/ML IJ SOLN
4.0000 [IU] | Freq: Three times a day (TID) | INTRAMUSCULAR | Status: DC
  Administered 2022-09-18: 4 [IU] via SUBCUTANEOUS

## 2022-09-17 MED ORDER — INSULIN ASPART 100 UNIT/ML IJ SOLN
0.0000 [IU] | Freq: Three times a day (TID) | INTRAMUSCULAR | Status: DC
  Administered 2022-09-18: 5 [IU] via SUBCUTANEOUS
  Administered 2022-09-18 (×2): 2 [IU] via SUBCUTANEOUS
  Administered 2022-09-19: 3 [IU] via SUBCUTANEOUS

## 2022-09-17 MED ORDER — ASPIRIN 81 MG PO TBEC
81.0000 mg | DELAYED_RELEASE_TABLET | Freq: Every day | ORAL | Status: DC
  Administered 2022-09-18 – 2022-09-19 (×2): 81 mg via ORAL
  Filled 2022-09-17 (×2): qty 1

## 2022-09-17 MED ORDER — SODIUM CHLORIDE 0.9 % IV BOLUS
1000.0000 mL | Freq: Once | INTRAVENOUS | Status: AC
  Administered 2022-09-17: 1000 mL via INTRAVENOUS

## 2022-09-17 MED ORDER — CHLORHEXIDINE GLUCONATE CLOTH 2 % EX PADS
6.0000 | MEDICATED_PAD | Freq: Every day | CUTANEOUS | Status: DC
  Administered 2022-09-17: 6 via TOPICAL

## 2022-09-17 MED ORDER — PANTOPRAZOLE SODIUM 40 MG PO TBEC
40.0000 mg | DELAYED_RELEASE_TABLET | Freq: Every day | ORAL | Status: DC
  Administered 2022-09-17 – 2022-09-19 (×3): 40 mg via ORAL
  Filled 2022-09-17 (×3): qty 1

## 2022-09-17 MED ORDER — ACETAMINOPHEN 325 MG PO TABS: 650.0000 mg | ORAL_TABLET | Freq: Four times a day (QID) | ORAL | Status: DC | PRN

## 2022-09-17 MED ORDER — ROSUVASTATIN CALCIUM 10 MG PO TABS
20.0000 mg | ORAL_TABLET | Freq: Every day | ORAL | Status: DC
  Administered 2022-09-18 – 2022-09-19 (×2): 20 mg via ORAL
  Filled 2022-09-17: qty 1
  Filled 2022-09-17 (×2): qty 2

## 2022-09-17 MED ORDER — ENOXAPARIN SODIUM 40 MG/0.4ML IJ SOSY
40.0000 mg | PREFILLED_SYRINGE | INTRAMUSCULAR | Status: DC
  Administered 2022-09-17 – 2022-09-18 (×2): 40 mg via SUBCUTANEOUS
  Filled 2022-09-17 (×2): qty 0.4

## 2022-09-17 MED ORDER — INSULIN ASPART 100 UNIT/ML IJ SOLN
0.0000 [IU] | Freq: Every day | INTRAMUSCULAR | Status: DC
  Administered 2022-09-18: 2 [IU] via SUBCUTANEOUS

## 2022-09-17 NOTE — ED Provider Notes (Signed)
[place] EMERGENCY DEPARTMENT AT [facility] Provider Note   CSN: [id number] Arrival date & time: 09/17/22  1055     History  Chief Complaint  Patient presents with   Hyperglycemia   Polyuria    Jacob Macdonald is a 77 y.o. male with history of type 2 diabetes, GERD, hypertension, anemia who presents the emergency department complaining of elevated blood sugar.  Patient states he checks his blood sugar "every now and then", other than this morning and last night, last time he checked it was about 2 weeks ago.  When checked at this morning his reading was over 500, his glucometer would not read that high.  Manage polyuria and polydipsia for the past several weeks.  Is on metformin Jardiance.  Not on insulin.  Wife reports patient has had more of a cough for 3+ days or so. Nonproductive, no fever.    Hyperglycemia Associated symptoms: increased thirst and polyuria        Home Medications Prior to Admission medications   Medication Sig Start Date End Date Taking? Authorizing Provider  acetaminophen (TYLENOL) 500 MG tablet Take 2 tablets (1,000 mg total) by mouth every 6 (six) hours as needed for mild pain. 12/15/17  Yes Trixie Deis R, PA-C  Fe Fum-FePoly-Vit C-Vit B3 (INTEGRA) 62.5-62.5-40-3 MG CAPS Take 1 capsule by mouth daily. 09/04/22  Yes [provider]  amLODipine (NORVASC) 5 MG tablet Take 5 mg by mouth daily.    [provider]  aspirin EC 81 MG tablet Take 81 mg by mouth daily.    [provider]  clonazePAM (KLONOPIN) 1 MG tablet Take 1 mg by mouth as needed for anxiety. 07/23/15   [provider]  Cyanocobalamin (B-12 PO) Take 1 tablet by mouth at bedtime.    [provider]  empagliflozin (JARDIANCE) 25 MG TABS tablet Take 25 mg by mouth daily.    [provider]  glipiZIDE (GLUCOTROL) 10 MG tablet Take 0.5 tablets (5 mg total) by mouth 2 (two) times daily before a meal. Patient not taking: Reported on  09/17/2022 05/21/22   Alwyn Ren, MD  hydrochlorothiazide (HYDRODIURIL) 25 MG tablet Take 25 mg by mouth daily. 07/07/22   [provider]  Iron, Ferrous Sulfate, 325 (65 Fe) MG TABS Take 325 mg by mouth 2 (two) times daily. 05/21/22   Alwyn Ren, MD  metFORMIN (GLUCOPHAGE-XR) 500 MG 24 hr tablet Take 1,000 mg by mouth 2 (two) times daily.    [provider]  omeprazole (PRILOSEC) 20 MG capsule Take 20 mg by mouth daily. 06/09/15   [provider]  Pyridoxine HCl (B-6 PO) Take 1 tablet by mouth at bedtime.    [provider]  rosuvastatin (CRESTOR) 20 MG tablet Take 20 mg by mouth daily.    [provider]  sitaGLIPtin (JANUVIA) 100 MG tablet Take 100 mg by mouth daily. 08/16/22   [provider]  vitamin C (ASCORBIC ACID) 250 MG tablet Take 2 tablets (500 mg total) by mouth daily. 05/21/22   Alwyn Ren, MD      Allergies    Lisinopril, Penicillins, Sertraline, and Shellfish allergy    Review of Systems   Review of Systems  Endocrine: Positive for polydipsia and polyuria.  All other systems reviewed and are negative.   Physical Exam Updated Vital Signs BP (!) 133/51 (BP Location: Right Arm)   Pulse 71   Temp 97.7 F (36.5 C) (Oral)   Resp 16  Ht 5\' 9"  (1.753 m)   Wt 62.6 kg   SpO2 99%   BMI 20.38 kg/m  Physical Exam Vitals and nursing note reviewed.  Constitutional:      Appearance: Normal appearance.  HENT:     Head: Normocephalic and atraumatic.  Eyes:     Conjunctiva/sclera: Conjunctivae normal.  Cardiovascular:     Rate and Rhythm: Normal rate and regular rhythm.  Pulmonary:     Effort: Pulmonary effort is normal. No respiratory distress.     Breath sounds: Normal breath sounds.  Abdominal:     General: There is no distension.     Palpations: Abdomen is soft.     Tenderness: There is no abdominal tenderness.  Skin:    General: Skin is warm and dry.  Neurological:     General: No focal  deficit present.     Mental Status: He is alert.     ED Results / Procedures / Treatments   Labs (all labs ordered are listed, but only abnormal results are displayed) Labs Reviewed  BASIC METABOLIC PANEL - Abnormal; Notable for the following components:      Result Value   Sodium 125 (*)    Potassium 3.3 (*)    Chloride 88 (*)    Glucose, Bld 742 (*)    BUN 37 (*)    Creatinine, Ser 1.70 (*)    Calcium 8.7 (*)    GFR, Estimated 42 (*)    All other components within normal limits  URINALYSIS, ROUTINE W REFLEX MICROSCOPIC - Abnormal; Notable for the following components:   Color, Urine STRAW (*)    Glucose, UA >=500 (*)    Hgb urine dipstick SMALL (*)    Ketones, ur 5 (*)    Protein, ur 30 (*)    Bacteria, UA RARE (*)    All other components within normal limits  BLOOD GAS, VENOUS - Abnormal; Notable for the following components:   pO2, Ven <31 (*)    Bicarbonate 31.2 (*)    Acid-Base Excess 4.5 (*)    All other components within normal limits  BETA-HYDROXYBUTYRIC ACID - Abnormal; Notable for the following components:   Beta-Hydroxybutyric Acid 0.92 (*)    All other components within normal limits  CBG MONITORING, ED - Abnormal; Notable for the following components:   Glucose-Capillary >600 (*)    All other components within normal limits  CBG MONITORING, ED - Abnormal; Notable for the following components:   Glucose-Capillary >600 (*)    All other components within normal limits  CBC  OSMOLALITY  BETA-HYDROXYBUTYRIC ACID    EKG None  Radiology DG Chest 2 View  Result Date: 09/17/2022 CLINICAL DATA:  Cough, hyperglycemia EXAM: CHEST - 2 VIEW COMPARISON:  12/15/2017 FINDINGS: The heart size and mediastinal contours are within normal limits. Both lungs are clear. Disc degenerative disease of the thoracic spine. IMPRESSION: No acute abnormality of the lungs. Electronically Signed   By: Jearld Lesch M.D.   On: 09/17/2022 14:15    Procedures .Critical  Care  Performed by: Su Monks, PA-C Authorized by: Su Monks, PA-C   Critical care provider statement:    Critical care time (minutes):  30   Critical care was necessary to treat or prevent imminent or life-threatening deterioration of the following conditions:  Metabolic crisis   Critical care was time spent personally by me on the following activities:  Development of treatment plan with patient or surrogate, discussions with consultants, evaluation of patient's response to  treatment, examination of patient, ordering and review of laboratory studies, ordering and review of radiographic studies, ordering and performing treatments and interventions, pulse oximetry, re-evaluation of patient's condition and review of old charts   Care discussed with: admitting provider   Comments:     Hyperglycemic crisis without DKA requiring insulin via endotool     Medications Ordered in ED Medications  insulin regular, human (MYXREDLIN) 100 units/ 100 mL infusion (9.5 Units/hr Intravenous New Bag/Given 09/17/22 1321)  lactated ringers infusion (has no administration in time range)  dextrose 5 % in lactated ringers infusion (has no administration in time range)  dextrose 50 % solution 0-50 mL (has no administration in time range)  potassium chloride 10 mEq in 100 mL IVPB (10 mEq Intravenous New Bag/Given 09/17/22 1327)  sodium chloride 0.9 % bolus 1,000 mL (0 mLs Intravenous Stopped 09/17/22 1308)  lactated ringers bolus 1,252 mL (1,252 mLs Intravenous New Bag/Given 09/17/22 1322)    ED Course/ Medical Decision Making/ A&P                             Medical Decision Making Amount and/or Complexity of Data Reviewed Labs: ordered.   This patient is a 77 y.o. male  who presents to the ED for concern of hyperglycemia.   Differential diagnoses prior to evaluation: The emergent differential diagnosis includes, but is not limited to,  DKA, HHS, hyperglycemic crisis, sepsis. This is not  an exhaustive differential.   Past Medical History / Co-morbidities / Social History: type 2 diabetes, GERD, hypertension, anemia   Physical Exam: Physical exam performed. The pertinent findings include: Mildly hypertensive, otherwise normal vital signs, no distress. No complaints currently.   Lab Tests/Imaging studies: I personally interpreted labs/imaging and the pertinent results include:  Initial CBG in triage > 600. Urinalysis with >=500, some ketones, negative for infection. Glucose on BMP 742, sodium 125, potassium 3.3, chloride 88. Normal CO2, no anion gap. BUN 37, creatinine 1.7, GFR 42, evidence of mild AKI. Osmolality pending. Beta-hydroxybutyric acid 0.92.  CXR without acute abnormality. I agree with the radiologist interpretation.  Medications: I ordered medication including IVF, insulin via endotool, potassium replacement.  I have reviewed the patients home medicines and have made adjustments as needed.  Consultations obtained: I consulted with hospitalist Dr Erenest Blank who will admit.    Disposition: After consideration of the diagnostic results and the patients response to treatment, I feel that patient would benefit from medical admission for hyperglycemia without DKA requiring insulin drip. Suspect some component of noncompliance with medications as patient had some confusion as to how much metformin he was to be taking at home. Does not regularly check blood sugars.   Final Clinical Impression(s) / ED Diagnoses Final diagnoses:  Hyperglycemia  Acute kidney injury (HCC)    Rx / DC Orders ED Discharge Orders     None      Portions of this report may have been transcribed using voice recognition software. Every effort was made to ensure accuracy; however, inadvertent computerized transcription errors may be present.    Jeanella Flattery 09/17/22 1423    Lorre Nick, MD 09/18/22 [phone]

## 2022-09-17 NOTE — H&P (Signed)
History and Physical  Jacob Macdonald UVO:[id number] DOB: 12/12/1945 DOA: 09/17/2022  PCP: Patient, No Pcp Per   Chief Complaint: hyperglycemia   HPI: Jacob Macdonald is a 77 y.o. male with medical history significant for hypertension, hyperlipidemia, type 2 diabetes on Jardiance and metformin who is being admitted to the hospital with hyperosmolar hyperglycemic state.  Patient's wife reports that he has had a slight cough for the last 3 to 4 days, nonproductive, no fever, sounded congested.  He denies any pain, nausea, vomiting, chills or any other infectious symptoms.  Patient states he has been drinking a lot of fluid, but he always does this.  States that he checks his blood sugar only intermittently, this past week, he got some blood work done in preparation for upcoming appointment with his PCP, he was called by the clinic last night and told that his blood sugar was very elevated and that he should come to the ER for evaluation.  In the meantime, he checked his blood sugar last night and again this morning, and it was reading greater than 500 on the monitor.  ED Course: He presented to the ER for evaluation, where he has been hemodynamically stable with some hypertension and saturating well on room air.  Lab work was done and demonstrated blood sugar of 742, creatinine 1.70, sodium 1.25, potassium 3.3.  Anion gap is normal, and CBC is unremarkable.  Beta hydroxybutyrate elevated at 0.92.  Venous blood gas was essentially unremarkable with normal pH 7.37.  Patient was given 1250 cc fluid bolus, and is currently getting LR at 125 cc/h.  He was also started on IV insulin bolus and drip.  His blood sugars currently improved to 566.  Review of Systems: Please see HPI for pertinent positives and negatives. A complete 10 system review of systems are otherwise negative.  Past Medical History:  Diagnosis Date   Diabetes mellitus without complication (HCC)    Distal radius fracture, left    GERD  (gastroesophageal reflux disease)    History of hiatal hernia    Hypertension    Past Surgical History:  Procedure Laterality Date   COLONOSCOPY     multiple   COLONOSCOPY WITH PROPOFOL N/A 05/21/2022   Procedure: COLONOSCOPY WITH PROPOFOL;  Surgeon: Iva Boop, MD;  Location: [facility];  Service: Gastroenterology;  Laterality: N/A;   ESOPHAGOGASTRODUODENOSCOPY     multiple   ESOPHAGOGASTRODUODENOSCOPY (EGD) WITH PROPOFOL N/A 05/21/2022   Procedure: ESOPHAGOGASTRODUODENOSCOPY (EGD) WITH PROPOFOL;  Surgeon: Iva Boop, MD;  Location: [facility];  Service: Gastroenterology;  Laterality: N/A;   GIVENS CAPSULE STUDY  2020   HERNIA REPAIR     OPEN REDUCTION INTERNAL FIXATION (ORIF) DISTAL RADIAL FRACTURE Left 08/03/2015   Procedure: OPEN TREATMENT OF LEFT DISTAL RADIUS FRACTURE;  Surgeon: Mack Hook, MD;  Location:  SURGERY CENTER;  Service: Orthopedics;  Laterality: Left;  GENERAL ANESTHESIA WITH PRE-OP BLOCK   SHOULDER ARTHROSCOPY Right     Social History:  reports that he has been smoking cigarettes. He does not have any smokeless tobacco history on file. He reports that he does not drink alcohol and does not use drugs.   Allergies  Allergen Reactions   Lisinopril Swelling    REACTION: swelling  Other Reaction(s): Angioedema, Unknown   Penicillins Other (See Comments)    REACTION: swelling Has patient had a PCN reaction causing immediate rash, facial/tongue/throat swelling, SOB or lightheadedness with hypotension: unknown Has patient had a PCN reaction causing severe rash involving mucus  membranes or skin necrosis: unknown Has patient had a PCN reaction that required hospitalization unknown Has patient had a PCN reaction occurring within the last 10 years: unknown If all of the above answers are "NO", then may proceed with Cephalosporin use.    Sertraline Diarrhea    Other Reaction(s): Unknown   Shellfish Allergy Nausea And Vomiting         No  family history on file.   Prior to Admission medications   Medication Sig Start Date End Date Taking? Authorizing Provider  acetaminophen (TYLENOL) 500 MG tablet Take 2 tablets (1,000 mg total) by mouth every 6 (six) hours as needed for mild pain. 12/15/17  Yes Trixie Deis R, PA-C  Fe Fum-FePoly-Vit C-Vit B3 (INTEGRA) 62.5-62.5-40-3 MG CAPS Take 1 capsule by mouth daily. 09/04/22  Yes [provider]  amLODipine (NORVASC) 5 MG tablet Take 5 mg by mouth daily.    [provider]  aspirin EC 81 MG tablet Take 81 mg by mouth daily.    [provider]  clonazePAM (KLONOPIN) 1 MG tablet Take 1 mg by mouth as needed for anxiety. 07/23/15   [provider]  Cyanocobalamin (B-12 PO) Take 1 tablet by mouth at bedtime.    [provider]  empagliflozin (JARDIANCE) 25 MG TABS tablet Take 25 mg by mouth daily.    [provider]  glipiZIDE (GLUCOTROL) 10 MG tablet Take 0.5 tablets (5 mg total) by mouth 2 (two) times daily before a meal. Patient not taking: Reported on 09/17/2022 05/21/22   Alwyn Ren, MD  hydrochlorothiazide (HYDRODIURIL) 25 MG tablet Take 25 mg by mouth daily. 07/07/22   [provider]  Iron, Ferrous Sulfate, 325 (65 Fe) MG TABS Take 325 mg by mouth 2 (two) times daily. 05/21/22   Alwyn Ren, MD  metFORMIN (GLUCOPHAGE-XR) 500 MG 24 hr tablet Take 1,000 mg by mouth 2 (two) times daily.    [provider]  omeprazole (PRILOSEC) 20 MG capsule Take 20 mg by mouth daily. 06/09/15   [provider]  Pyridoxine HCl (B-6 PO) Take 1 tablet by mouth at bedtime.    [provider]  rosuvastatin (CRESTOR) 20 MG tablet Take 20 mg by mouth daily.    [provider]  sitaGLIPtin (JANUVIA) 100 MG tablet Take 100 mg by mouth daily. 08/16/22   [provider]  vitamin C (ASCORBIC ACID) 250 MG tablet Take 2 tablets (500 mg total) by mouth daily. 05/21/22   Alwyn Ren, MD     Physical Exam: BP (!) 133/51 (BP Location: Right Arm)   Pulse 71   Temp 97.7 F (36.5 C) (Oral)   Resp 16   Ht 5\' 9"  (1.753 m)   Wt 62.6 kg   SpO2 99%   BMI 20.38 kg/m   General:  Alert, oriented, calm, in no acute distress  Eyes: EOMI, clear conjuctivae, white sclerea Neck: supple, no masses, trachea mildline  Cardiovascular: RRR, no murmurs or rubs, no peripheral edema  Respiratory: clear to auscultation bilaterally, no wheezes, no crackles  Abdomen: soft, nontender, nondistended, normal bowel tones heard  Skin: dry, no rashes  Musculoskeletal: no joint effusions, normal range of motion  Psychiatric: appropriate affect, normal speech  Neurologic: extraocular muscles intact, clear speech, moving all extremities with intact sensorium          Labs on Admission:  Basic Metabolic Panel: Recent Labs  Lab 09/17/22 1148  NA 125*  K 3.3*  CL 88*  CO2  26  GLUCOSE 742*  BUN 37*  CREATININE 1.70*  CALCIUM 8.7*   Liver Function Tests: No results for input(s): "AST", "ALT", "ALKPHOS", "BILITOT", "PROT", "ALBUMIN" in the last 168 hours. No results for input(s): "LIPASE", "AMYLASE" in the last 168 hours. No results for input(s): "AMMONIA" in the last 168 hours. CBC: Recent Labs  Lab 09/17/22 1148  WBC 5.3  HGB 15.1  HCT 42.3  MCV 87.6  PLT 179   Cardiac Enzymes: No results for input(s): "CKTOTAL", "CKMB", "CKMBINDEX", "TROPONINI" in the last 168 hours.  BNP (last 3 results) No results for input(s): "BNP" in the last 8760 hours.  ProBNP (last 3 results) No results for input(s): "PROBNP" in the last 8760 hours.  CBG: Recent Labs  Lab 09/17/22 1058 09/17/22 1353 09/17/22 1429  GLUCAP >600* >600* 566*    Radiological Exams on Admission: DG Chest 2 View  Result Date: 09/17/2022 CLINICAL DATA:  Cough, hyperglycemia EXAM: CHEST - 2 VIEW COMPARISON:  12/15/2017 FINDINGS: The heart size and mediastinal contours are within normal limits. Both lungs are clear.  Disc degenerative disease of the thoracic spine. IMPRESSION: No acute abnormality of the lungs. Electronically Signed   By: Jearld Lesch M.D.   On: 09/17/2022 14:15    Assessment/Plan Principal Problem:   Hyperosmolar hyperglycemic state (HHS) (HCC)-likely due to poorly controlled diabetes, possibly exacerbated by recent vira URI though no evidence of acute infection currently. -Inpatient admission to stepdown unit -Keep n.p.o. -Continue IV fluid infusion, and IV insulin -Check hemoglobin A1c -Once blood sugar significantly improved and stabilized, he can be started on a diabetic diet, with basal bolus dosing -Patient and wife do not adhere to low-carb diet, will consult RD for education  Pseudohyponatremia due to significant hyperglycemia as above-since blood pressure improving will recheck a BMP in a couple of hours  Hypertension-continue home amlodipine, hold HCTZ for now due to his pseudohyponatremia  Hypokalemia-very mild, in the setting of hyperglycemia, being repleted with 40 mEq IV potassium  AKI-baseline creatinine is normal, this is likely ATN due to relative dehydration in the setting of severe hyperglycemia -Avoid nephrotoxins including his home hydrochlorothiazide -He is being aggressively hydrated as above -Follow renal function with daily labs  GERD-p.o. Protonix  Iron deficiency anemia-continue iron sulfate  DVT prophylaxis: Lovenox     Code Status: Full Code  Consults called: None  Admission status: The appropriate patient status for this patient is INPATIENT. Inpatient status is judged to be reasonable and necessary in order to provide the required intensity of service to ensure the patient's safety. The patient's presenting symptoms, physical exam findings, and initial radiographic and laboratory data in the context of their chronic comorbidities is felt to place them at high risk for further clinical deterioration. Furthermore, it is not anticipated that the  patient will be medically stable for discharge from the hospital within 2 midnights of admission.    I certify that at the point of admission it is my clinical judgment that the patient will require inpatient hospital care spanning beyond 2 midnights from the point of admission due to high intensity of service, high risk for further deterioration and high frequency of surveillance required  Time spent: 58 minutes  Jacob Macdonald Sharlette Dense MD [facility] Pager [phone]  If 7PM-7AM, please contact night-coverage [email or web address] Password [facility]  09/17/2022, 2:36 PM

## 2022-09-17 NOTE — ED Triage Notes (Signed)
Pt arrived via POV. CBG at PCP was over 500. Pt reports polyuria, and polydipsia.   Hx T2D  AOx4

## 2022-09-17 NOTE — ED Notes (Signed)
ED TO INPATIENT HANDOFF REPORT  Name/Age/Gender Jacob Macdonald 77 y.o. male  Code Status    Code Status Orders  (From admission, onward)           Start     Ordered   09/17/22 1434  Full code  Continuous       Question:  By:  Answer:  Consent: discussion documented in EHR   09/17/22 1434           Code Status History     Date Active Date Inactive Code Status Order ID Comments User Context   05/19/2022 2005 05/21/2022 1923 Full Code [id number]  Anselm Jungling, DO ED       Home/SNF/Other Home  Chief Complaint Hyperosmolar hyperglycemic state (HHS) (HCC) [E11.00]  Level of Care/Admitting Diagnosis ED Disposition     ED Disposition  Admit   Condition  --   Comment  Hospital Area: [facility] [[id number]]  Level of Care: Stepdown [14]  Admit to SDU based on following criteria: Severe physiological/psychological symptoms:  Any diagnosis requiring assessment & intervention at least every 4 hours on an ongoing basis to obtain desired patient outcomes including stability and rehabilitation  May admit patient to [facility] or [facility] if equivalent level of care is available:: Yes  Covid Evaluation: Asymptomatic - no recent exposure (last 10 days) testing not required  Diagnosis: Hyperosmolar hyperglycemic state (HHS) [facility]) [[id number]]  Admitting Physician: Maryln Gottron [[id number]]  Attending Physician: Olexa.Dam, MIR Jaxson.Roy [[id number]]  Certification:: I certify there are rare and unusual circumstances requiring inpatient admission  Estimated Length of Stay: 3          Medical History Past Medical History:  Diagnosis Date   Diabetes mellitus without complication (HCC)    Distal radius fracture, left    GERD (gastroesophageal reflux disease)    History of hiatal hernia    Hypertension     Allergies Allergies  Allergen Reactions   Lisinopril Swelling    REACTION: swelling  Other Reaction(s): Angioedema, Unknown   Penicillins Other (See  Comments)    REACTION: swelling Has patient had a PCN reaction causing immediate rash, facial/tongue/throat swelling, SOB or lightheadedness with hypotension: unknown Has patient had a PCN reaction causing severe rash involving mucus membranes or skin necrosis: unknown Has patient had a PCN reaction that required hospitalization unknown Has patient had a PCN reaction occurring within the last 10 years: unknown If all of the above answers are "NO", then may proceed with Cephalosporin use.    Sertraline Diarrhea    Other Reaction(s): Unknown   Shellfish Allergy Nausea And Vomiting         IV Location/Drains/Wounds Patient Lines/Drains/Airways Status     Active Line/Drains/Airways     Name Placement date Placement time Site Days   Peripheral IV 09/17/22 20 G Anterior;Left Forearm 09/17/22  1200  Forearm  less than 1   Peripheral IV 09/17/22 20 G Anterior;Right Forearm 09/17/22  1317  Forearm  less than 1            Labs/Imaging Results for orders placed or performed during the hospital encounter of 09/17/22 (from the past 48 hour(s))  CBG monitoring, ED     Status: Abnormal   Collection Time: 09/17/22 10:58 AM  Result Value Ref Range   Glucose-Capillary >600 (HH) 70 - 99 mg/dL    Comment: Glucose reference range applies only to samples taken after fasting for at least 8 hours.  Urinalysis, Routine w reflex  microscopic -Urine, Clean Catch     Status: Abnormal   Collection Time: 09/17/22 11:10 AM  Result Value Ref Range   Color, Urine STRAW (A) YELLOW   APPearance CLEAR CLEAR   Specific Gravity, Urine 1.024 1.005 - 1.030   pH 6.0 5.0 - 8.0   Glucose, UA >=500 (A) NEGATIVE mg/dL   Hgb urine dipstick SMALL (A) NEGATIVE   Bilirubin Urine NEGATIVE NEGATIVE   Ketones, ur 5 (A) NEGATIVE mg/dL   Protein, ur 30 (A) NEGATIVE mg/dL   Nitrite NEGATIVE NEGATIVE   Leukocytes,Ua NEGATIVE NEGATIVE   RBC / HPF 0-5 0 - 5 RBC/hpf   WBC, UA 0-5 0 - 5 WBC/hpf   Bacteria, UA RARE (A) NONE  SEEN   Squamous Epithelial / HPF 0-5 0 - 5 /HPF    Comment: Performed at [facility], [address] [place], [place] [address]  Basic metabolic panel     Status: Abnormal   Collection Time: 09/17/22 11:48 AM  Result Value Ref Range   Sodium 125 (L) 135 - 145 mmol/L   Potassium 3.3 (L) 3.5 - 5.1 mmol/L   Chloride 88 (L) 98 - 111 mmol/L   CO2 26 22 - 32 mmol/L   Glucose, Bld 742 (HH) 70 - 99 mg/dL    Comment: CRITICAL RESULT CALLED TO, READ BACK BY AND VERIFIED WITH SAVIOE, B RN @ 1226 ON 09/17/2022 BY XIONG, K Glucose reference range applies only to samples taken after fasting for at least 8 hours.    BUN 37 (H) 8 - 23 mg/dL   Creatinine, Ser 9.56 (H) 0.61 - 1.24 mg/dL   Calcium 8.7 (L) 8.9 - 10.3 mg/dL   GFR, Estimated 42 (L) >60 mL/min    Comment: (NOTE) Calculated using the CKD-EPI Creatinine Equation (2021)    Anion gap 11 5 - 15    Comment: Performed at [facility], [address] [place], [place] [address]  CBC     Status: None   Collection Time: 09/17/22 11:48 AM  Result Value Ref Range   WBC 5.3 4.0 - 10.5 K/uL   RBC 4.83 4.22 - 5.81 MIL/uL   Hemoglobin 15.1 13.0 - 17.0 g/dL   HCT 65.7 84.6 - 96.2 %   MCV 87.6 80.0 - 100.0 fL   MCH 31.3 26.0 - 34.0 pg   MCHC 35.7 30.0 - 36.0 g/dL   RDW 95.2 84.1 - 32.4 %   Platelets 179 150 - 400 K/uL   nRBC 0.0 0.0 - 0.2 %    Comment: Performed at [facility], [address] [place], [place] [address]  Blood gas, venous     Status: Abnormal   Collection Time: 09/17/22  1:15 PM  Result Value Ref Range   pH, Ven 7.37 7.25 - 7.43   pCO2, Ven 54 44 - 60 mmHg   pO2, Ven <31 (LL) 32 - 45 mmHg    Comment: CRITICAL RESULT CALLED TO, READ BACK BY AND VERIFIED WITH: SAVIOE, B RN @ 1334 ON 09/17/2022 BY Deedra Ehrich, K    Bicarbonate 31.2 (H) 20.0 - 28.0 mmol/L   Acid-Base Excess 4.5 (H) 0.0 - 2.0 mmol/L   O2 Saturation 46.4 %   Patient temperature 37.0     Comment: Performed at  [facility], [address] [place], [place] [address]  Beta-hydroxybutyric acid     Status: Abnormal   Collection Time: 09/17/22  1:15 PM  Result Value Ref Range  Beta-Hydroxybutyric Acid 0.92 (H) 0.05 - 0.27 mmol/L    Comment: Performed at [facility], [address], [place], [place] [address]  CBG monitoring, ED     Status: Abnormal   Collection Time: 09/17/22  1:53 PM  Result Value Ref Range   Glucose-Capillary >600 (HH) 70 - 99 mg/dL    Comment: Glucose reference range applies only to samples taken after fasting for at least 8 hours.  CBG monitoring, ED     Status: Abnormal   Collection Time: 09/17/22  2:29 PM  Result Value Ref Range   Glucose-Capillary 566 (HH) 70 - 99 mg/dL    Comment: Glucose reference range applies only to samples taken after fasting for at least 8 hours.   Comment 1 Notify RN    DG Chest 2 View  Result Date: 09/17/2022 CLINICAL DATA:  Cough, hyperglycemia EXAM: CHEST - 2 VIEW COMPARISON:  12/15/2017 FINDINGS: The heart size and mediastinal contours are within normal limits. Both lungs are clear. Disc degenerative disease of the thoracic spine. IMPRESSION: No acute abnormality of the lungs. Electronically Signed   By: Jearld Lesch M.D.   On: 09/17/2022 14:15    Pending Labs Unresulted Labs (From admission, onward)     Start     Ordered   09/18/22 0500  Basic metabolic panel  Tomorrow morning,   R        09/17/22 1434   09/18/22 0500  CBC  Tomorrow morning,   R        09/17/22 1434   09/17/22 1600  Basic metabolic panel  Once,   R        09/17/22 1434   09/17/22 1243  Beta-hydroxybutyric acid  (Hyperglycemic Hyperosmolar State (HHS))  Now then every 8 hours,   STAT (with URGENT occurrences)      09/17/22 1242   09/17/22 1143  Osmolality  Once,   URGENT        09/17/22 1142            Vitals/Pain Today's Vitals   09/17/22 1100 09/17/22 1108 09/17/22 1406  BP: (!) 159/59  (!) 133/51  Pulse: 88  71   Resp: 18  16  Temp: 98 F (36.7 C)  97.7 F (36.5 C)  TempSrc: Oral  Oral  SpO2: 98%  99%  Weight:  62.6 kg   Height:  5\' 9"  (1.753 m)   PainSc:  0-No pain     Isolation Precautions No active isolations  Medications Medications  insulin regular, human (MYXREDLIN) 100 units/ 100 mL infusion (9.5 Units/hr Intravenous New Bag/Given 09/17/22 1321)  lactated ringers infusion (has no administration in time range)  dextrose 5 % in lactated ringers infusion (has no administration in time range)  dextrose 50 % solution 0-50 mL (has no administration in time range)  potassium chloride 10 mEq in 100 mL IVPB (10 mEq Intravenous New Bag/Given 09/17/22 1436)  aspirin EC tablet 81 mg (has no administration in time range)  amLODipine (NORVASC) tablet 5 mg (has no administration in time range)  rosuvastatin (CRESTOR) tablet 20 mg (has no administration in time range)  pantoprazole (PROTONIX) EC tablet 40 mg (has no administration in time range)  Iron (Ferrous Sulfate) TABS 325 mg (has no administration in time range)  clonazePAM (KLONOPIN) tablet 1 mg (has no administration in time range)  enoxaparin (LOVENOX) injection 40 mg (has no administration in time range)  acetaminophen (TYLENOL) tablet 650 mg (has no administration in time range)  Or  acetaminophen (TYLENOL) suppository 650 mg (has no administration in time range)  traZODone (DESYREL) tablet 25 mg (has no administration in time range)  ondansetron (ZOFRAN) tablet 4 mg (has no administration in time range)    Or  ondansetron (ZOFRAN) injection 4 mg (has no administration in time range)  albuterol (PROVENTIL) (2.5 MG/3ML) 0.083% nebulizer solution 2.5 mg (has no administration in time range)  sodium chloride 0.9 % bolus 1,000 mL (0 mLs Intravenous Stopped 09/17/22 1308)  lactated ringers bolus 1,252 mL (1,252 mLs Intravenous New Bag/Given 09/17/22 1322)    Mobility walks

## 2022-09-18 ENCOUNTER — Encounter (HOSPITAL_COMMUNITY): Payer: Self-pay | Admitting: Internal Medicine

## 2022-09-18 DIAGNOSIS — E876 Hypokalemia: Secondary | ICD-10-CM | POA: Insufficient documentation

## 2022-09-18 DIAGNOSIS — N179 Acute kidney failure, unspecified: Secondary | ICD-10-CM | POA: Diagnosis not present

## 2022-09-18 DIAGNOSIS — E1165 Type 2 diabetes mellitus with hyperglycemia: Secondary | ICD-10-CM | POA: Diagnosis not present

## 2022-09-18 DIAGNOSIS — I10 Essential (primary) hypertension: Secondary | ICD-10-CM | POA: Diagnosis not present

## 2022-09-18 DIAGNOSIS — E1100 Type 2 diabetes mellitus with hyperosmolarity without nonketotic hyperglycemic-hyperosmolar coma (NKHHC): Secondary | ICD-10-CM | POA: Diagnosis not present

## 2022-09-18 DIAGNOSIS — K219 Gastro-esophageal reflux disease without esophagitis: Secondary | ICD-10-CM | POA: Insufficient documentation

## 2022-09-18 LAB — GLUCOSE, CAPILLARY
Glucose-Capillary: 124 mg/dL — ABNORMAL HIGH (ref 70–99)
Glucose-Capillary: 146 mg/dL — ABNORMAL HIGH (ref 70–99)
Glucose-Capillary: 209 mg/dL — ABNORMAL HIGH (ref 70–99)
Glucose-Capillary: 247 mg/dL — ABNORMAL HIGH (ref 70–99)

## 2022-09-18 LAB — BASIC METABOLIC PANEL
Anion gap: 9 (ref 5–15)
BUN: 21 mg/dL (ref 8–23)
CO2: 26 mmol/L (ref 22–32)
Calcium: 9 mg/dL (ref 8.9–10.3)
Chloride: 103 mmol/L (ref 98–111)
Creatinine, Ser: 1.18 mg/dL (ref 0.61–1.24)
GFR, Estimated: >60 mL/min (ref >60)
Glucose, Bld: 122 mg/dL — ABNORMAL HIGH (ref 70–99)
Potassium: 3.1 mmol/L — ABNORMAL LOW (ref 3.5–5.1)
Sodium: 138 mmol/L (ref 135–145)

## 2022-09-18 LAB — CBC
HCT: 43.1 % (ref 39.0–52.0)
Hemoglobin: 14.8 g/dL (ref 13.0–17.0)
MCH: 30 pg (ref 26.0–34.0)
MCHC: 34.3 g/dL (ref 30.0–36.0)
MCV: 87.4 fL (ref 80.0–100.0)
Platelets: 193 10*3/uL (ref 150–400)
RBC: 4.93 MIL/uL (ref 4.22–5.81)
RDW: 13 % (ref 11.5–15.5)
WBC: 6.1 10*3/uL (ref 4.0–10.5)
nRBC: 0 % (ref 0.0–0.2)

## 2022-09-18 LAB — HEMOGLOBIN A1C
Hgb A1c MFr Bld: 14.8 % — ABNORMAL HIGH (ref 4.8–5.6)
Mean Plasma Glucose: 378.06 mg/dL

## 2022-09-18 MED ORDER — PNEUMOCOCCAL 20-VAL CONJ VACC 0.5 ML IM SUSY
0.5000 mL | PREFILLED_SYRINGE | INTRAMUSCULAR | Status: AC
  Administered 2022-09-19: 0.5 mL via INTRAMUSCULAR
  Filled 2022-09-18: qty 0.5

## 2022-09-18 MED ORDER — INSULIN STARTER KIT- PEN NEEDLES (ENGLISH)
1.0000 | Freq: Once | Status: AC
  Administered 2022-09-18: 1
  Filled 2022-09-18: qty 1

## 2022-09-18 MED ORDER — POTASSIUM CHLORIDE CRYS ER 20 MEQ PO TBCR
40.0000 meq | EXTENDED_RELEASE_TABLET | ORAL | Status: AC
  Administered 2022-09-18 (×2): 40 meq via ORAL
  Filled 2022-09-18 (×2): qty 2

## 2022-09-18 MED ORDER — LACTATED RINGERS IV SOLN: INTRAVENOUS | Status: DC

## 2022-09-18 NOTE — Plan of Care (Signed)
  Problem: Health Behavior/Discharge Planning: Goal: Ability to manage health-related needs will improve Outcome: Progressing   Problem: Clinical Measurements: Goal: Ability to maintain clinical measurements within normal limits will improve Outcome: Progressing Goal: Will remain free from infection Outcome: Progressing   

## 2022-09-18 NOTE — Progress Notes (Signed)
  Progress Note   Patient: Jacob Macdonald WGN:[id number] DOB: 08/09/1945 DOA: 09/17/2022     1 DOS: the patient was seen and examined on 09/18/2022   Brief hospital course: 77 year old man PMH including diabetes mellitus type 2 on Jardiance and metformin presented with hyperglycemia.  Admitted for hyperosmolar hyperglycemic state, AKI.  Assessment and Plan: Hyperosmolar hyperglycemic state (HCC)- resolved Secondary to poorly controlled diabetes, possibly exacerbated by recent vira URI though no evidence of acute infection currently. Now resolved, CBG >600 on admit >>> <200 for more than 12 hours  DM type 2 with hyperglycemia, not on insulin, HgbA1c 14.8 CBGs <200 now. Started on long-acting insulin, he is willing to continue. Resume metformin on discharge Consult DM coordinator, dietician, needs teaching on self-administration of insulin  AKI-resolving Creatinine 1.7 > 1.32 > 1.18 (baseline 0.8-1) Likely prerenal secondary to osmotic diuresis complicated by hydrochlorothiazide Improving, continue IVF, check BMP in AM, continue to hold HCTZ  Hypokalemia K+ 3.1, replete  Essential hypertension Stable. Continue home amlodipine, hold HCTZ for now     GERD-p.o. Protonix     Subjective:  Feels better No pain Tolerating diet Not on insulin, but willing to start it  Physical Exam: Vitals:   09/18/22 0600 09/18/22 0700 09/18/22 0800 09/18/22 0825  BP: (!) 145/47 (!) 124/57    Pulse: (!) 59 (!) 57 90   Resp: 11 10 (!) 23   Temp:    97.7 F (36.5 C)  TempSrc:    Oral  SpO2: 96% 97% 99%   Weight:      Height:       Physical Exam Vitals reviewed.  Constitutional:      General: He is not in acute distress.    Appearance: He is not ill-appearing or toxic-appearing.  Cardiovascular:     Rate and Rhythm: Normal rate and regular rhythm.     Heart sounds: No murmur heard. Pulmonary:     Effort: Pulmonary effort is normal. No respiratory distress.     Breath sounds: No  wheezing, rhonchi or rales.  Neurological:     Mental Status: He is alert.  Psychiatric:        Mood and Affect: Mood normal.        Behavior: Behavior normal.     Data Reviewed: AFVSS CBG >600 on admit >>> <200 for more than 12 hours K+ 3.1, replete Creatinine 1.7 > 1.32 > 1.18 (baseline 0.8-1) CBC WNL HgbA1c 14.8 CXR NAD  Family Communication: none  Disposition: Status is: Inpatient Remains inpatient appropriate because: hyperglycemia     Time spent: 25 minutes  Author: Brendia Sacks, MD 09/18/2022 10:07 AM  For on call review [email or web address].

## 2022-09-18 NOTE — Inpatient Diabetes Management (Signed)
Inpatient Diabetes Program Recommendations  AACE/ADA: New Consensus Statement on Inpatient Glycemic Control   Target Ranges:  Prepandial:   less than 140 mg/dL      Peak postprandial:   less than 180 mg/dL (1-2 hours)      Critically ill patients:  140 - 180 mg/dL    Latest Reference Range & Units 09/17/22 16:27 09/17/22 18:12 09/17/22 19:10 09/17/22 20:00 09/17/22 21:10 09/18/22 07:59  Glucose-Capillary 70 - 99 mg/dL 841 (H) 324 (H) 401 (H) 185 (H) 153 (H) 124 (H)     Latest Reference Range & Units 09/17/22 11:48  CO2 22 - 32 mmol/L 26  Glucose 70 - 99 mg/dL 027 (HH)  Anion gap 5 - 15  11    Latest Reference Range & Units 09/18/22 02:51  Hemoglobin A1C 4.8 - 5.6 % 14.8 (H)   Review of Glycemic Control  Diabetes history: DM2 Outpatient Diabetes medications: Jardiance 25 mg daily, Glipizide 5 mg BID (not taking), Metformin XR 500 mg BID, Januvia 100 mg daily Current orders for Inpatient glycemic control: Semglee 12 units BID, Novolog 0-15 units TID with meals, Novolog 0-5 units QHS  Inpatient Diabetes Program Recommendations:    Insulin: Noted patient was transitioned off IV insulin on 7/20; was given Semglee 12 units at 19:06 and IV insulin was stopped 21:03 on 09/17/22. CBG 124 mg/dl this morning and patient already given Semglee 12 units today.  NOTE: Noted consult for Diabetes Coordinator. Diabetes Coordinator is not on campus over the weekend but available by pager from 8am to 5pm for questions or concerns. Chart reviewed. Patient admitted with HHS, AKI, and hypokalemia. Lab glucose 742 mg/dl on 2/53/66 and patient was started on IV insulin which was transitioned to SQ insulin regimen yesterday. In reviewing chart, noted patient was seen at [facility] on 08/16/22 and per note patient was instructed to continue Metformin XR 2000 mg daily, Jardiance 25 mg daily, stop Glipizide, and start Januvia 100 mg daily. Inpatient diabetes team will follow up with patient on Monday (09/19/22) when back on  campus.  Thanks, Orlando Penner, RN, MSN, CDCES Diabetes Coordinator Inpatient Diabetes Program [phone] (Team Pager from 8am to 5pm)

## 2022-09-18 NOTE — Hospital Course (Addendum)
77 year old man PMH including diabetes mellitus type 2 on Jardiance and metformin presented with hyperglycemia.  Admitted for hyperosmolar hyperglycemic state, AKI.

## 2022-09-19 ENCOUNTER — Other Ambulatory Visit (HOSPITAL_COMMUNITY): Payer: Self-pay

## 2022-09-19 DIAGNOSIS — N179 Acute kidney failure, unspecified: Secondary | ICD-10-CM

## 2022-09-19 DIAGNOSIS — E1100 Type 2 diabetes mellitus with hyperosmolarity without nonketotic hyperglycemic-hyperosmolar coma (NKHHC): Secondary | ICD-10-CM

## 2022-09-19 DIAGNOSIS — E1165 Type 2 diabetes mellitus with hyperglycemia: Secondary | ICD-10-CM

## 2022-09-19 LAB — BASIC METABOLIC PANEL
Anion gap: 10 (ref 5–15)
BUN: 20 mg/dL (ref 8–23)
CO2: 24 mmol/L (ref 22–32)
Calcium: 9 mg/dL (ref 8.9–10.3)
Chloride: 104 mmol/L (ref 98–111)
Creatinine, Ser: 1.21 mg/dL (ref 0.61–1.24)
GFR, Estimated: >60 mL/min (ref >60)
Glucose, Bld: 76 mg/dL (ref 70–99)
Potassium: 3.7 mmol/L (ref 3.5–5.1)
Sodium: 138 mmol/L (ref 135–145)

## 2022-09-19 LAB — GLUCOSE, CAPILLARY
Glucose-Capillary: 86 mg/dL (ref 70–99)
Glucose-Capillary: 171 mg/dL — ABNORMAL HIGH (ref 70–99)

## 2022-09-19 MED ORDER — PEN NEEDLES 31G X 5 MM MISC: 1.0000 | Freq: Three times a day (TID) | 0 refills | Status: AC

## 2022-09-19 MED ORDER — GLUCERNA SHAKE PO LIQD
237.0000 mL | Freq: Two times a day (BID) | ORAL | Status: DC
  Administered 2022-09-19: 237 mL via ORAL
  Filled 2022-09-19: qty 237

## 2022-09-19 MED ORDER — ADULT MULTIVITAMIN W/MINERALS CH: 1.0000 | ORAL_TABLET | Freq: Every day | ORAL | Status: DC

## 2022-09-19 MED ORDER — INSULIN GLARGINE 100 UNIT/ML SOLOSTAR PEN: 15.0000 [IU] | PEN_INJECTOR | Freq: Every day | SUBCUTANEOUS | 0 refills | Status: AC

## 2022-09-19 MED ORDER — INSULIN ASPART 100 UNIT/ML FLEXPEN: 4.0000 [IU] | PEN_INJECTOR | Freq: Three times a day (TID) | SUBCUTANEOUS | 0 refills | Status: AC

## 2022-09-19 NOTE — Plan of Care (Signed)
  Problem: Education: Goal: Knowledge of General Education information will improve Description: Including pain rating scale, medication(s)/side effects and non-pharmacologic comfort measures Outcome: Progressing   Problem: Health Behavior/Discharge Planning: Goal: Ability to manage health-related needs will improve Outcome: Progressing   Problem: Clinical Measurements: Goal: Ability to maintain clinical measurements within normal limits will improve Outcome: Progressing Goal: Will remain free from infection Outcome: Progressing   Problem: Nutrition: Goal: Adequate nutrition will be maintained Outcome: Progressing   Problem: Education: Goal: Ability to describe self-care measures that may prevent or decrease complications (Diabetes Survival Skills Education) will improve Outcome: Progressing Goal: Individualized Educational Video(s) Outcome: Progressing

## 2022-09-19 NOTE — Inpatient Diabetes Management (Signed)
Inpatient Diabetes Program Recommendations  AACE/ADA: New Consensus Statement on Inpatient Glycemic Control (2015)  Target Ranges:  Prepandial:   less than 140 mg/dL      Peak postprandial:   less than 180 mg/dL (1-2 hours)      Critically ill patients:  140 - 180 mg/dL   Lab Results  Component Value Date   GLUCAP 171 (H) 09/19/2022   HGBA1C 14.8 (H) 09/18/2022    Review of Glycemic Control  Diabetes history: DM2 Outpatient Diabetes medications: Jardiance 25 every day, metformin 500 mg BID, Januvia 100 mg QD Current orders for Inpatient glycemic control: Semglee 12 units BID, Novolog 0-15 TID with meals and 0-5 HS  HgbA1C - 14.8% (average blood sugar approx 400 mg/dL)  Inpatient Diabetes Program Recommendations:    Spoke with pt at bedside regarding his diabetes and HgbA1C of 14.8%. Pt very surprised his HgbA1C was that high. States it was around 7-8% several months ago. Pt has meter and checks blood sugars at home, although not on regular basis. Also states he previously took Ozempic last year.  Discussed going home on insulin and pt is willing to do what it takes to control his blood sugars. Discussed blood sugar and HgbA1C goal of < 8%.  Educated patient on insulin pen use at home. Reviewed contents of insulin flexpen starter kit. Reviewed all steps if insulin pen including attachment of needle, 2-unit air shot, dialing up dose, giving injection, removing needle, disposal of sharps, storage of unused insulin, disposal of insulin etc. Patient able to provide successful return demonstration. Also reviewed troubleshooting with insulin pen. MD to give patient Rxs for insulin pens and insulin pen needles. Instructed on hypoglycemia s/s and treatment. Pt acknowledged understanding.  Has f/u appt at [facility] and will take blood sugar log for review.   Will check with pharmacy to see which insulins are preferred.  Would likely recommend Lantus 20 at bedtime and Novolog/Humalog 5-6 units TID.    Thank you. Ailene Ards, RD, LDN, CDCES Inpatient Diabetes Coordinator [phone]

## 2022-09-19 NOTE — Progress Notes (Signed)
Transition of Care [facility]) - Inpatient Brief Assessment   Patient Details  Name: Jacob Macdonald MRN: [id number] Date of Birth: 14-Oct-1945  [facility] CM/SW Contact:    Durenda Guthrie, RN Phone Number: 09/19/2022, 11:32 AM   Clinical Narrative:    Transition of Care Asessment: Insurance and Status: (P) Insurance coverage has been reviewed Patient has primary care physician: (P) Yes Home environment has been reviewed: (P) from home with wife Prior level of function:: (P) independent   Social Determinants of Health Reivew: (P) SDOH reviewed no interventions necessary Readmission risk has been reviewed: (P) Yes Transition of care needs: (P) no transition of care needs at this time

## 2022-09-19 NOTE — Discharge Summary (Signed)
Physician Discharge Summary   Patient: Jacob Macdonald MRN: [id number] DOB: 07-Aug-1945  Admit date:     09/17/2022  Discharge date: 09/19/22  Discharge Physician: Brendia Sacks   PCP: Georgianne Fick, MD   Recommendations at discharge:   Hyperglycemic state.  Started on insulin.  Discharge Diagnoses: Principal Problem:   Hyperosmolar hyperglycemic state (HHS) (HCC) Active Problems:   Uncontrolled type 2 diabetes mellitus with hyperglycemia, without long-term current use of insulin (HCC)   Essential hypertension   AKI (acute kidney injury) (HCC)   Hypokalemia   GERD (gastroesophageal reflux disease)  Resolved Problems:   * No resolved hospital problems. *  Hospital Course: 77 year old man PMH including diabetes mellitus type 2 on Jardiance and metformin presented with hyperglycemia.  Admitted for hyperosmolar hyperglycemic state, AKI. Condition gradually improved.  Was started on insulin and discharged home in good condition.  Hyperosmolar hyperglycemic state (HCC)- resolved Secondary to poorly controlled diabetes, possibly exacerbated by recent vira URI though no evidence of acute infection currently. CBG >600 on admit   Resolved with standard treatment   DM type 2 with hyperglycemia, not on insulin, HgbA1c 14.8 Started on long-acting insulin, he is willing to continue. Consulted DM coordinator, dietician.was receptive to teaching on self-administration of insulin CBGs <250 last 24 hours with fasting 86 this AM.  Resume metformin on discharge.  Stop Sitagliptin. Will discharge on long-acting insulin and short acting meal coverage.   AKI-resolving Prerenal secondary to osmotic diuresis complicated by hydrochlorothiazide Creatinine 1.7 > 1.32 > 1.18 > 1.21 (baseline 0.8-1) Near baseline. Stop IVF. Hold hydrochlorothiazide on discharge   Hypokalemia -- resolved   Essential hypertension Remains stable. Continue home amlodipine, hold HCTZ for now     GERD-p.o.  Protonix       Consultants:  None   Procedures performed:  None    Disposition: Home Diet recommendation:  Carb modified diet DISCHARGE MEDICATION: Allergies as of 09/19/2022       Reactions   Lisinopril Swelling   REACTION: swelling Other Reaction(s): Angioedema, Unknown   Penicillins Other (See Comments)   REACTION: swelling Has patient had a PCN reaction causing immediate rash, facial/tongue/throat swelling, SOB or lightheadedness with hypotension: unknown Has patient had a PCN reaction causing severe rash involving mucus membranes or skin necrosis: unknown Has patient had a PCN reaction that required hospitalization unknown Has patient had a PCN reaction occurring within the last 10 years: unknown If all of the above answers are "NO", then may proceed with Cephalosporin use.   Sertraline Diarrhea   Other Reaction(s): Unknown   Shellfish Allergy Nausea And Vomiting           Medication List     STOP taking these medications    glipiZIDE 10 MG tablet Commonly known as: GLUCOTROL   hydrochlorothiazide 25 MG tablet Commonly known as: HYDRODIURIL   sitaGLIPtin 100 MG tablet Commonly known as: JANUVIA       TAKE these medications    acetaminophen 500 MG tablet Commonly known as: TYLENOL Take 2 tablets (1,000 mg total) by mouth every 6 (six) hours as needed for mild pain.   amLODipine 10 MG tablet Commonly known as: NORVASC Take 10 mg by mouth daily.   aspirin EC 81 MG tablet Take 81 mg by mouth daily.   B-12 PO Take 1 tablet by mouth at bedtime.   B-6 PO Take 1 tablet by mouth at bedtime.   clonazePAM 0.5 MG tablet Commonly known as: KLONOPIN Take 0.5 mg by mouth at bedtime  as needed for anxiety. What changed: Another medication with the same name was removed. Continue taking this medication, and follow the directions you see here.   diclofenac 75 MG EC tablet Commonly known as: VOLTAREN Take 75 mg by mouth daily.   insulin aspart 100  UNIT/ML FlexPen Commonly known as: NOVOLOG Inject 4 Units into the skin 3 (three) times daily with meals. Only take if eating a meal AND Blood Glucose (BG) is 80 or higher.   insulin glargine 100 UNIT/ML Solostar Pen Commonly known as: LANTUS Inject 15 Units into the skin at bedtime. May substitute as needed per insurance.   Integra 62.5-62.5-40-3 MG Caps Take 1 capsule by mouth daily.   Iron (Ferrous Sulfate) 325 (65 Fe) MG Tabs Take 325 mg by mouth 2 (two) times daily.   Jardiance 25 MG Tabs tablet Generic drug: empagliflozin Take 25 mg by mouth daily.   metFORMIN 500 MG 24 hr tablet Commonly known as: GLUCOPHAGE-XR Take 500 mg by mouth 2 (two) times daily.   omeprazole 20 MG capsule Commonly known as: PRILOSEC Take 20 mg by mouth daily.   Pen Needles 31G X 5 MM Misc 1 each by Does not apply route 3 (three) times daily. May dispense any manufacturer covered by patient's insurance.   rosuvastatin 40 MG tablet Commonly known as: CRESTOR Take 40 mg by mouth daily.   vitamin C 250 MG tablet Commonly known as: ASCORBIC ACID Take 2 tablets (500 mg total) by mouth daily.        Follow-up Information     Georgianne Fick, MD. Schedule an appointment as soon as possible for a visit in 1 week(s).   Specialty: Internal Medicine Contact information: [address] [place] [address] [phone]                Feels better  Discharge Exam: Filed Weights   09/17/22 1108 09/17/22 1602  Weight: 62.6 kg 60.8 kg   Physical Exam Vitals reviewed.  Constitutional:      General: He is not in acute distress.    Appearance: He is not ill-appearing or toxic-appearing.  Cardiovascular:     Rate and Rhythm: Normal rate and regular rhythm.     Heart sounds: No murmur heard. Pulmonary:     Effort: Pulmonary effort is normal. No respiratory distress.     Breath sounds: No wheezing, rhonchi or rales.  Neurological:     Mental Status: He is alert.   Psychiatric:        Mood and Affect: Mood normal.        Behavior: Behavior normal.      Condition at discharge: good  The results of significant diagnostics from this hospitalization (including imaging, microbiology, ancillary and laboratory) are listed below for reference.   Imaging Studies: DG Chest 2 View  Result Date: 09/17/2022 CLINICAL DATA:  Cough, hyperglycemia EXAM: CHEST - 2 VIEW COMPARISON:  12/15/2017 FINDINGS: The heart size and mediastinal contours are within normal limits. Both lungs are clear. Disc degenerative disease of the thoracic spine. IMPRESSION: No acute abnormality of the lungs. Electronically Signed   By: Jearld Lesch M.D.   On: 09/17/2022 14:15    Microbiology: Results for orders placed or performed during the hospital encounter of 09/17/22  MRSA Next Gen by PCR, Nasal     Status: None   Collection Time: 09/17/22  4:00 PM   Specimen: Nasal Mucosa; Nasal Swab  Result Value Ref Range Status   MRSA by PCR Next  Gen NOT DETECTED NOT DETECTED Final    Comment: (NOTE) The GeneXpert MRSA Assay (FDA approved for NASAL specimens only), is one component of a comprehensive MRSA colonization surveillance program. It is not intended to diagnose MRSA infection nor to guide or monitor treatment for MRSA infections. Test performance is not FDA approved in patients less than 28 years old. Performed at [facility], [address], [place], [place] [address]     Labs: CBC: Recent Labs  Lab 09/17/22 1148 09/18/22 0251  WBC 5.3 6.1  HGB 15.1 14.8  HCT 42.3 43.1  MCV 87.6 87.4  PLT 179 193   Basic Metabolic Panel: Recent Labs  Lab 09/17/22 1148 09/17/22 1641 09/18/22 0251 09/19/22 0443  NA 125* 138 138 138  K 3.3* 3.4* 3.1* 3.7  CL 88* 100 103 104  CO2 26 28 26 24   GLUCOSE 742* 126* 122* 76  BUN 37* 30* 21 20  CREATININE 1.70* 1.32* 1.18 1.21  CALCIUM 8.7* 9.2 9.0 9.0   Liver Function Tests: No results for input(s): "AST",  "ALT", "ALKPHOS", "BILITOT", "PROT", "ALBUMIN" in the last 168 hours. CBG: Recent Labs  Lab 09/18/22 1153 09/18/22 1645 09/18/22 2048 09/19/22 0750 09/19/22 1119  GLUCAP 146* 209* 247* 86 171*    Discharge time spent: less than 30 minutes.  Signed: Brendia Sacks, MD [facility] 09/19/2022

## 2022-09-19 NOTE — TOC Benefit Eligibility Note (Signed)
Pharmacy Patient Advocate Encounter  Insurance verification completed.    The patient is insured through [employer]/ [employer]   Ran test claim for Lantus Pen and the current 30 day co-pay is $35.00.  Ran test claim for Novolog FlexPen and the current 30 day co-pay is $35.00.  Ran test claim for [employer] and the current 30 day co-pay is $0.00.   This test claim was processed through [facility]- copay amounts may vary at other pharmacies due to pharmacy/plan contracts, or as the patient moves through the different stages of their insurance plan.    Roland Earl, CPHT Pharmacy Patient Advocate Specialist [facility] Patient Advocate Team Direct Number: [phone]  Fax: [phone]

## 2022-09-19 NOTE — Progress Notes (Signed)
Initial Nutrition Assessment  DOCUMENTATION CODES:   Not applicable  INTERVENTION:  T2DM handouts added to AVS   Continue carb modified diet   MVI   Trial Glucerna Shake po BID, each supplement provides 220 kcal and 10 grams of protein   NUTRITION DIAGNOSIS:   Limited adherence to nutrition-related recommendations related to limited prior education as evidenced by per patient/family report.   GOAL:   Patient will meet greater than or equal to 90% of their needs, Other (Comment) (patient will adhere to prescribed diet)   MONITOR:   PO intake, Labs, I & O's, Supplement acceptance, Weight trends, Skin  REASON FOR ASSESSMENT:   Consult Assessment of nutrition requirement/status, Diet education  ASSESSMENT:   77 y.o. male with PMHx including HTN, HLD, tobacco abuse, and T2DM, on Jardiance and metformin who was admitted due to hyperosmolar hyperglycemic state and AKI  Patient's BG was 500+ prior to coming to the ED   Patient was drinking a lot of fluids PTA but states this is his baseline so he did not think much of it. He only takes his BG intermittently.   Per MD, patient and his wife do not adhere to carb modified diet at home   Patient to begin insulin regimen at home upon dc   Labs:  CBG 171, A1c 14.8 Meds: ferrous sulfate, insulin, protonix, crestor, lactated ringers @75   Wt: 5.8 kg  (8.8%) wt loss x 4 months  09/17/22 60.8 kg  05/21/22 65.8 kg   PO: 100% po intake x last 3 documented meals  I/O's: +2 L since admission     NUTRITION - FOCUSED PHYSICAL EXAM:  RD working remotely   Diet Order:   Diet Order             Diet Carb Modified Fluid consistency: Thin  Diet effective now                   EDUCATION NEEDS:   Education needs have been addressed  Skin:  Skin Assessment: Reviewed RN Assessment  Last BM:  unknown  Height:   Ht Readings from Last 1 Encounters:  09/17/22 5\' 8"  (1.727 m)    Weight:   Wt Readings from Last 1  Encounters:  09/17/22 60.8 kg    Ideal Body Weight:     BMI:  Body mass index is 20.38 kg/m.  Estimated Nutritional Needs:   Kcal:  1525-1830  Protein:  73-92 g  Fluid:  >/= 1.8 L   Leodis Rains, RDN, LDN  Clinical Nutrition

## 2022-09-22 DIAGNOSIS — E1165 Type 2 diabetes mellitus with hyperglycemia: Secondary | ICD-10-CM | POA: Diagnosis not present

## 2022-09-22 DIAGNOSIS — I10 Essential (primary) hypertension: Secondary | ICD-10-CM | POA: Diagnosis not present

## 2022-09-22 DIAGNOSIS — E11649 Type 2 diabetes mellitus with hypoglycemia without coma: Secondary | ICD-10-CM | POA: Diagnosis not present

## 2022-09-22 DIAGNOSIS — E782 Mixed hyperlipidemia: Secondary | ICD-10-CM | POA: Diagnosis not present

## 2022-09-29 DIAGNOSIS — E11649 Type 2 diabetes mellitus with hypoglycemia without coma: Secondary | ICD-10-CM | POA: Diagnosis not present

## 2022-09-29 DIAGNOSIS — E1165 Type 2 diabetes mellitus with hyperglycemia: Secondary | ICD-10-CM | POA: Diagnosis not present

## 2022-11-03 DIAGNOSIS — E1165 Type 2 diabetes mellitus with hyperglycemia: Secondary | ICD-10-CM | POA: Diagnosis not present

## 2022-11-03 DIAGNOSIS — E11649 Type 2 diabetes mellitus with hypoglycemia without coma: Secondary | ICD-10-CM | POA: Diagnosis not present

## 2022-12-15 DIAGNOSIS — Z23 Encounter for immunization: Secondary | ICD-10-CM | POA: Diagnosis not present

## 2022-12-15 DIAGNOSIS — E782 Mixed hyperlipidemia: Secondary | ICD-10-CM | POA: Diagnosis not present

## 2022-12-15 DIAGNOSIS — E11649 Type 2 diabetes mellitus with hypoglycemia without coma: Secondary | ICD-10-CM | POA: Diagnosis not present

## 2022-12-15 DIAGNOSIS — I10 Essential (primary) hypertension: Secondary | ICD-10-CM | POA: Diagnosis not present

## 2022-12-15 DIAGNOSIS — E1165 Type 2 diabetes mellitus with hyperglycemia: Secondary | ICD-10-CM | POA: Diagnosis not present

## 2023-02-13 DIAGNOSIS — E11649 Type 2 diabetes mellitus with hypoglycemia without coma: Secondary | ICD-10-CM | POA: Diagnosis not present

## 2023-02-13 DIAGNOSIS — E782 Mixed hyperlipidemia: Secondary | ICD-10-CM | POA: Diagnosis not present

## 2023-02-13 DIAGNOSIS — I10 Essential (primary) hypertension: Secondary | ICD-10-CM | POA: Diagnosis not present

## 2023-02-13 DIAGNOSIS — E1165 Type 2 diabetes mellitus with hyperglycemia: Secondary | ICD-10-CM | POA: Diagnosis not present

## 2023-02-20 DIAGNOSIS — E1165 Type 2 diabetes mellitus with hyperglycemia: Secondary | ICD-10-CM | POA: Diagnosis not present

## 2023-04-24 DIAGNOSIS — Z23 Encounter for immunization: Secondary | ICD-10-CM | POA: Diagnosis not present

## 2023-04-24 DIAGNOSIS — E1165 Type 2 diabetes mellitus with hyperglycemia: Secondary | ICD-10-CM | POA: Diagnosis not present

## 2023-06-19 DIAGNOSIS — E1165 Type 2 diabetes mellitus with hyperglycemia: Secondary | ICD-10-CM | POA: Diagnosis not present

## 2023-06-19 DIAGNOSIS — E782 Mixed hyperlipidemia: Secondary | ICD-10-CM | POA: Diagnosis not present

## 2023-06-26 DIAGNOSIS — I10 Essential (primary) hypertension: Secondary | ICD-10-CM | POA: Diagnosis not present

## 2023-06-26 DIAGNOSIS — E782 Mixed hyperlipidemia: Secondary | ICD-10-CM | POA: Diagnosis not present

## 2023-06-26 DIAGNOSIS — E1165 Type 2 diabetes mellitus with hyperglycemia: Secondary | ICD-10-CM | POA: Diagnosis not present

## 2023-06-26 DIAGNOSIS — D500 Iron deficiency anemia secondary to blood loss (chronic): Secondary | ICD-10-CM | POA: Diagnosis not present

## 2023-10-11 DIAGNOSIS — D500 Iron deficiency anemia secondary to blood loss (chronic): Secondary | ICD-10-CM | POA: Diagnosis not present

## 2023-10-11 DIAGNOSIS — E782 Mixed hyperlipidemia: Secondary | ICD-10-CM | POA: Diagnosis not present

## 2023-10-11 DIAGNOSIS — R5383 Other fatigue: Secondary | ICD-10-CM | POA: Diagnosis not present

## 2023-10-11 DIAGNOSIS — E1165 Type 2 diabetes mellitus with hyperglycemia: Secondary | ICD-10-CM | POA: Diagnosis not present

## 2023-10-11 DIAGNOSIS — I10 Essential (primary) hypertension: Secondary | ICD-10-CM | POA: Diagnosis not present

## 2023-10-18 DIAGNOSIS — E782 Mixed hyperlipidemia: Secondary | ICD-10-CM | POA: Diagnosis not present

## 2023-10-18 DIAGNOSIS — Z0000 Encounter for general adult medical examination without abnormal findings: Secondary | ICD-10-CM | POA: Diagnosis not present

## 2023-10-18 DIAGNOSIS — I10 Essential (primary) hypertension: Secondary | ICD-10-CM | POA: Diagnosis not present

## 2023-10-18 DIAGNOSIS — E1165 Type 2 diabetes mellitus with hyperglycemia: Secondary | ICD-10-CM | POA: Diagnosis not present

## 2023-10-18 DIAGNOSIS — D500 Iron deficiency anemia secondary to blood loss (chronic): Secondary | ICD-10-CM | POA: Diagnosis not present
